# Patient Record
Sex: Male | Born: 1945 | Race: White | Hispanic: No | Marital: Married | Smoking: Never smoker
Health system: Southern US, Community
[De-identification: ages and names within clinical notes are randomized; demographics above are authoritative.]

## PROBLEM LIST (undated history)

## (undated) DIAGNOSIS — I10 Essential (primary) hypertension: Secondary | ICD-10-CM

## (undated) DIAGNOSIS — J40 Bronchitis, not specified as acute or chronic: Secondary | ICD-10-CM

## (undated) DIAGNOSIS — J449 Chronic obstructive pulmonary disease, unspecified: Secondary | ICD-10-CM

## (undated) DIAGNOSIS — J189 Pneumonia, unspecified organism: Secondary | ICD-10-CM

## (undated) DIAGNOSIS — M1990 Unspecified osteoarthritis, unspecified site: Secondary | ICD-10-CM

## (undated) DIAGNOSIS — Z87898 Personal history of other specified conditions: Secondary | ICD-10-CM

## (undated) DIAGNOSIS — R7303 Prediabetes: Secondary | ICD-10-CM

## (undated) DIAGNOSIS — G709 Myoneural disorder, unspecified: Secondary | ICD-10-CM

## (undated) HISTORY — DX: Bronchitis, not specified as acute or chronic: J40

## (undated) HISTORY — PX: EYE SURGERY: SHX253

## (undated) HISTORY — DX: Essential (primary) hypertension: I10

---

## 2011-06-01 HISTORY — PX: BACK SURGERY: SHX140

## 2012-07-01 HISTORY — PX: TOTAL HIP ARTHROPLASTY: SHX124

## 2018-07-23 ENCOUNTER — Ambulatory Visit: Payer: Medicare Other | Admitting: Allergy & Immunology

## 2018-07-23 ENCOUNTER — Encounter: Payer: Self-pay | Admitting: Allergy & Immunology

## 2018-07-23 VITALS — BP 138/82 | HR 56 | Temp 97.7°F | Resp 16 | Ht 71.0 in | Wt 185.0 lb

## 2018-07-23 DIAGNOSIS — L2389 Allergic contact dermatitis due to other agents: Secondary | ICD-10-CM | POA: Diagnosis not present

## 2018-07-23 MED ORDER — CETIRIZINE HCL 10 MG PO TABS: 10.0000 mg | ORAL_TABLET | Freq: Two times a day (BID) | ORAL | 5 refills | Status: DC | PRN

## 2018-07-23 NOTE — Progress Notes (Signed)
NEW PATIENT  Date of Service/Encounter:  07/23/18  Referring provider: Worthy Rancher, MD   Assessment:   Allergic contact dermatitis due to poison sumac  Plan/Recommendations:   1. Allergic contact dermatitis due to poison sumac - Unfortunately, there is no immunotherapy for treating sensitivity to poison sumac.  - Once your immune system Reilly started reacting, it is hard to get it under control.  - In the future, I would recommend using a detergent like Tecnu to remove the oil after exposure (use within four hours of exposure). - It might be useful to keep this with you during hikes and wipe yourself off with it during the hike itself. - Definitely try the preventative ointment that you talked to me about too (email me with the name of it to [email or web address]). - In the meantime, start a long term prednisone taper: 30mg  (three tablets) twice daily for six days, 20mg  (two tablets) twice daily for six days, 10mg  (two tablets) twice daily for six days, 10mg  (one tablet) daily for six days, then STOP. - Restart the Zyrtec (cetirizine) 10mg  but increase to 2-3 times daily for the next 1-2 weeks until the prednisone starts to work, then change to one tablet daily thereafter until you have fully healed.   2. Return if symptoms worsen or fail to improve.  Subjective:   Stephen Reilly is a 72 y.o. male presenting today for evaluation of  Chief Complaint  Patient presents with  . Allergic Reaction    posion sumac, 2 steriod shots and prednisone started in May    Stephen Reilly a history of the following: There are no active problems to display for this patient.   History obtained from: chart review and patient.  [name] was referred by Stephen Rancher, MD.     [name] is a 72 y.o. male presenting for an evaluation of a rash. He got his first case of poison sumac at [facility] and ad an outbreak in May 2019. He got a steroid shot at that time with  triamcinolone. Symptoms improved and resolved completely. He never knew that he was allergic to the poison sumac until this year and prior ot this he did not have any problems with this. He was actually on a trail that was not well kept and overgrown, which is why there was so much poison sumac.   Then he hiked at [facility]. He knew he was going through poison sumac at that time. He Reilly now had symptoms for six weeks. He Reilly been to four different visits and received multiple courses of steroids. He Reilly had problems with sleeping difficulties. He did have cetirizine prescribed at one point (10mg  daily for 30 days). He received this on July 10th.    However, despite this, he Reilly continued to have problems with the rash. It will improve, but then when the five days of prednisone is finished, it will flare back. He now Reilly some preventative lotions to put on before future hikes.   Otherwise, there is no history of other atopic diseases, including asthma, drug allergies, food allergies, environmental allergies, stinging insect allergies, or urticaria. There is no significant infectious history. Vaccinations are up to date.    Past Medical History: There are no active problems to display for this patient.   Medication List:  Allergies as of 07/23/2018      Reactions   Oxycodone Nausea Only, Other (See Comments)   Sweats, nausea and low BP Sweats, nausea and low BP  Medication List        Accurate as of 07/23/18  1:16 PM. Always use your most recent med list.          atorvastatin 10 MG tablet Commonly known as:  LIPITOR   gabapentin 300 MG capsule Commonly known as:  NEURONTIN gabapentin 300 mg capsule  TAKE 1 TO 2 CAPSULES 3 TIMES A DAY AS NEEDED   triamcinolone ointment 0.1 % Commonly known as:  KENALOG Apply to affected areas 1-2x/day prn       Birth History: non-contributory.   Developmental History: non-contributory.   Past Surgical History: Past Surgical  History:  Procedure Laterality Date  . BACK SURGERY  06/2011  . 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 Asthma Neg Hx   . Allergic rhinitis Neg Hx   . Immunodeficiency Neg Hx   . Angioedema Neg Hx   . Eczema Neg Hx   . Urticaria Neg Hx      Social History: Stephen HiddenGary lives at home with his wife. They live in a 72 year old home.  There is carpeting throughout the home.  They have gas and electric heating with central cooling.  There is 1 dog and one bird in the home.  There are no dust mite covers on the bedding.  There is no tobacco exposure.  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  [place].  He Reilly been an avid hiker for the past 6 years since retiring.    Review of Systems: a 14-point review of systems is pertinent for what is mentioned in HPI.  Otherwise, all other systems were negative. Constitutional: negative other than that listed in the HPI Eyes: negative other than that listed in the HPI Ears, nose, mouth, throat, and face: negative other than that listed in the HPI Respiratory: negative other than that listed in the HPI Cardiovascular: negative other than that listed in the HPI Gastrointestinal: negative other than that listed in the HPI Genitourinary: negative other than that listed in the HPI Integument: negative other than that listed in the HPI Hematologic: negative other than that listed in the HPI Musculoskeletal: negative other than that listed in the HPI Neurological: negative other than that listed in the HPI Allergy/Immunologic: negative other than that listed in the HPI    Objective:   Blood pressure 138/82, pulse (!) 56, temperature 97.7 F (36.5 C), temperature source Oral, resp. rate 16, height 5\' 11"  (1.803 m), weight 185 lb (83.9 kg), SpO2 98 %. Body mass index is 25.8 kg/m.   Physical Exam:  General:  Alert, interactive, in no acute distress. Pleasant male. Talkative.  Eyes: No conjunctival injection bilaterally, no discharge on the right, no discharge on the left and no Horner-Trantas dots present. PERRL bilaterally. EOMI without pain. No photophobia.  Ears: Right TM pearly gray with normal light reflex, Left TM pearly gray with normal light reflex, Right TM intact without perforation and Left TM intact without perforation.  Nose/Throat: External nose within normal limits and septum midline. Turbinates edematous and pale with clear discharge. Posterior oropharynx erythematous with cobblestoning in the posterior oropharynx. Tonsils 2+ without exudates.  Tongue without thrush and Geographic tongue present. Neck: Supple without thyromegaly. Trachea midline. Adenopathy: no enlarged lymph nodes appreciated in the anterior cervical, occipital, axillary, epitrochlear, inguinal, or popliteal regions. Lungs: Clear to auscultation without wheezing,  rhonchi or rales. No increased work of breathing. CV: Normal S1/S2. No murmurs. Capillary refill <2 seconds.  Abdomen: Nondistended, nontender. No guarding or rebound tenderness. Bowel sounds present in all fields and hypoactive  Skin: Dry, erythematous, excoriated patches on the bilateral arms, legs, and neck. There is no active drainage and no honey crusting. Extremities:  No clubbing, cyanosis or edema. Neuro:   Grossly intact. No focal deficits appreciated. Responsive to questions.  Diagnostic studies: none      Malachi Bonds, MD [facility]

## 2018-07-23 NOTE — Patient Instructions (Addendum)
1. Allergic contact dermatitis due to poison sumac - Unfortunately, there is no immunotherapy for treating sensitivity to poison sumac.  - Once your immune system has started reacting, it is hard to get it under control.  - In the future, I would recommend using a detergent like Tecnu to remove the oil after exposure (use within four hours of exposure). - It might be useful to keep this with you during hikes and wipe yourself off with it during the hike itself. - Definitely try the preventative ointment that you talked to me about too (email me with the name of it to [name]). - In the meantime, start a long term prednisone taper: 30mg  (three tablets) twice daily for six days, 20mg  (two tablets) twice daily for six days, 10mg  (two tablets) twice daily for six days, 10mg  (one tablet) daily for six days, then STOP. - Restart the Zyrtec (cetirizine) 10mg  but increase to 2-3 times daily for the next 1-2 weeks until the prednisone starts to work, then change to one tablet daily thereafter until you have fully healed.   2. Return if symptoms worsen or fail to improve.   Please inform us of any Emergency Department visits, hospitalizations, or changes in symptoms. Call us before going to the ED for breathing or allergy symptoms since we might be able to fit you in for a sick visit. Feel free to contact us anytime with any questions, problems, or concerns.  It was a pleasure to meet you today!  Websites that have reliable patient information: 1. American Academy of Asthma, Allergy, and Immunology: www.aaaai.org 2. Food Allergy Research and Education (FARE): foodallergy.org 3. Mothers of Asthmatics: http://www.asthmacommunitynetwork.org 4. American College of Allergy, Asthma, and Immunology: MissingWeapons.cawww.acaai.org   Make sure you are registered to vote! If you have moved or changed any of your contact information, you will need to get this updated before voting!

## 2018-07-23 NOTE — Addendum Note (Signed)
Addended by: Maryjean MornFREEMAN, LOGAN D on: 07/23/2018 01:48 PM   Modules accepted: Orders

## 2018-08-11 ENCOUNTER — Telehealth: Payer: Self-pay | Admitting: Allergy

## 2018-08-11 NOTE — Telephone Encounter (Signed)
Patient called and said he was down to one prednisone tablet a day. Said the poison sumac was coming back worse.Said at first the prednisone helped but as he tapped down it came back. Patient is really frustrated he says he has constant itching Wanted to know what to do? Phone number [phone]. Thanks

## 2018-08-12 NOTE — Telephone Encounter (Signed)
Let's have him come in for a DepoMedrol 80mg  injection and we can do another two week prednisone taper. Has he had new exposures at all?   After he gets his injection, please send him home with the following taper:  20mg  BID for 5 days, 10mg  BID for five days, 10mg  QD for 5 days, then STOP (35 tablets total).   I will talk to my colleagues about other options.   Malachi BondsJoel Desha Bitner, MD [facility]

## 2018-08-13 NOTE — Telephone Encounter (Signed)
Called home number and received no answer.

## 2018-08-20 NOTE — Telephone Encounter (Signed)
Called patient this morning and did not received answer.

## 2018-09-02 NOTE — Telephone Encounter (Signed)
I have attempted to contact this patient multiple times. It has now been 2 weeks. I will complete this message and see if patient calls back later.

## 2018-09-14 ENCOUNTER — Ambulatory Visit: Payer: Medicare Other | Admitting: Pediatrics

## 2018-09-14 ENCOUNTER — Encounter: Payer: Self-pay | Admitting: Pediatrics

## 2018-09-14 VITALS — BP 140/78 | HR 62 | Temp 97.8°F | Resp 16

## 2018-09-14 DIAGNOSIS — L255 Unspecified contact dermatitis due to plants, except food: Secondary | ICD-10-CM | POA: Insufficient documentation

## 2018-09-14 MED ORDER — TRIAMCINOLONE ACETONIDE 0.1 % EX OINT: TOPICAL_OINTMENT | CUTANEOUS | 2 refills | Status: DC

## 2018-09-14 MED ORDER — METHYLPREDNISOLONE ACETATE 80 MG/ML IJ SUSP
80.0000 mg | Freq: Once | INTRAMUSCULAR | Status: AC
  Administered 2018-09-14: 80 mg via INTRAMUSCULAR

## 2018-09-14 NOTE — Patient Instructions (Addendum)
Depo-Medrol 80 mg- 1 injection today Triamcinolone 0.1% ointment twice a day if needed to red itchy areas below the face Tomorrow start prednisone 10 mg twice a day for 4 days, 10 mg on the fifth day Call us if you are not doing well on this treatment plan

## 2018-09-14 NOTE — Progress Notes (Signed)
  [address] [place] [address] Dept: [phone]  FOLLOW UP NOTE  Patient ID: Stephen Reilly, male    DOB: 1946-10-05  Age: 72 y.o. MRN: [id number] Date of Office Visit: 09/14/2018  Assessment  Chief Complaint: Pruritis  HPI Stephen Reilly presents for follow-up of a contact dermatitis to poison ivy and  poison sumac. In May of this year he had a poison ivy reaction which required the use of steroids and prednisone for several weeks.  On July 1 of this summer he was exposed to poison sumac and again had a reaction.  He was given prednisone for about 2 weeks.  Since then he has had several areas of red itchy rash.  His last dose of prednisone was in mid August   Drug Allergies:  Allergies  Allergen Reactions  . Oxycodone Nausea Only and Other (See Comments)    Sweats, nausea and low BP Sweats, nausea and low BP     Physical Exam: BP 140/78   Pulse 62   Temp 97.8 F (36.6 C) (Oral)   Resp 16   SpO2 94%    Physical Exam  Constitutional: He is oriented to person, place, and time. He appears well-developed and well-nourished.  HENT:  Eyes normal.  Ears normal.  Nose normal.  Pharynx normal.  Neck: Neck supple.  Cardiovascular:  S1-S2 normal no murmurs  Pulmonary/Chest:  Clear to percussion and auscultation  Lymphadenopathy:    He has no cervical adenopathy.  Neurological: He is alert and oriented to person, place, and time.  Skin:  Several poison  sumac like areas on his arms legs and neck  Psychiatric: He has a normal mood and affect. His behavior is normal. Judgment and thought content normal.  Vitals reviewed.   Diagnostics:    Assessment and Plan: 1. Dermatitis due to plants, including poison ivy, sumac, and oak     Meds ordered this encounter  Medications  . methylPREDNISolone acetate (DEPO-MEDROL) injection 80 mg  . triamcinolone ointment (KENALOG) 0.1 %    Sig: Apply twice daily as needed to red itchy areas below the face    Dispense:  60 g   Refill:  2    Patient Instructions  Depo-Medrol 80 mg- 1 injection today Triamcinolone 0.1% ointment twice a day if needed to red itchy areas below the face Tomorrow start prednisone 10 mg twice a day for 4 days, 10 mg on the fifth day Call us if you are not doing well on this treatment plan   Return if symptoms worsen or fail to improve.    Thank you for the opportunity to care for this patient.  Please do not hesitate to contact me with questions.  Tonette Bihari, M.D.  [facility] [address] [place], [place] [address] [phone]

## 2018-09-22 ENCOUNTER — Telehealth: Payer: Self-pay | Admitting: Allergy

## 2018-09-22 ENCOUNTER — Other Ambulatory Visit: Payer: Self-pay | Admitting: Allergy

## 2018-09-22 MED ORDER — PREDNISONE 10 MG PO TABS: ORAL_TABLET | ORAL | 0 refills | Status: DC

## 2018-09-22 NOTE — Telephone Encounter (Signed)
Faxed in prednisone and informed patient. Informed Patient of Dr. Nunzio Cobbs note.

## 2018-09-22 NOTE — Telephone Encounter (Signed)
Please provide prednisone 10 mg daily for the next 5 days.  If he is still having a problem he should come in and see Dr. Beaulah Dinning or Thurston Hole on Monday.

## 2018-09-22 NOTE — Telephone Encounter (Signed)
Patient seen Dr. Beaulah Dinning on 09-14-2018 .Got a depro-medrol injection and started prednisone the next day. Finished it on Sunday. Started itching really bad last night. Wanted to know if we could call out some  more prednisone? Dermatitis due to plants Please advise. Thanks

## 2018-10-05 ENCOUNTER — Telehealth: Payer: Self-pay

## 2018-10-05 NOTE — Telephone Encounter (Signed)
Pt was given prednisone by Dr. Nunzio Cobbs on 09/22/18 but is still breaking out in a rash from poison sumac. Was wondering if he needed more prednisone or if there was anything else he could do. Please advise

## 2018-10-06 NOTE — Telephone Encounter (Signed)
Call patient.  If he is still breaking out and  the creams are not taking care of it , please call in prednisone 10 mg twice a day for 4 days, 10 mg on the fifth day

## 2018-10-06 NOTE — Telephone Encounter (Signed)
Called pt, no answer and couldn't leave message

## 2018-10-18 NOTE — Telephone Encounter (Signed)
Tried calling pt no answer

## 2018-11-05 ENCOUNTER — Ambulatory Visit: Payer: Medicare Other | Admitting: Allergy

## 2018-11-05 ENCOUNTER — Encounter: Payer: Self-pay | Admitting: Allergy

## 2018-11-05 VITALS — BP 134/60 | HR 100 | Temp 97.9°F | Resp 16

## 2018-11-05 DIAGNOSIS — R21 Rash and other nonspecific skin eruption: Secondary | ICD-10-CM | POA: Diagnosis not present

## 2018-11-05 DIAGNOSIS — L299 Pruritus, unspecified: Secondary | ICD-10-CM

## 2018-11-05 MED ORDER — HYDROXYZINE HCL 10 MG PO TABS: 10.0000 mg | ORAL_TABLET | Freq: Every day | ORAL | 5 refills | Status: DC | PRN

## 2018-11-05 NOTE — Patient Instructions (Addendum)
Get bloodwork  Start zyrtec 10mg  in the morning Start hydroxyzine 10-20mg  1 hour before bedtime as needed for itching.  Stop using the lavendar oils and creams.  Wash shoes or get rid off the shoes that you went through the poison sumac field  Follow up in 4 weeks and consider patch testing in the future.      Skin care recommendations  Bath time: . Always use lukewarm water. AVOID very hot or cold water. Marland Kitchen. Keep bathing time to 5-10 minutes. . Do NOT use bubble bath. . Use a mild soap and use just enough to wash the dirty areas. . Do NOT scrub skin vigorously.  . After bathing, pat dry your skin with a towel. Do NOT rub or scrub the skin.  Moisturizers and prescriptions:  . ALWAYS apply moisturizers immediately after bathing (within 3 minutes). This helps to lock-in moisture. . Use the moisturizer several times a day over the whole body. Peri Jefferson. Good summer moisturizers include: Aveeno, CeraVe, Cetaphil. Peri Jefferson. Good winter moisturizers include: Aquaphor, Vaseline, Cerave, Cetaphil, Eucerin, Vanicream. . When using moisturizers along with medications, the moisturizer should be applied about one hour after applying the medication to prevent diluting effect of the medication or moisturize around where you applied the medications. When not using medications, the moisturizer can be continued twice daily as maintenance.  Laundry and clothing: . Avoid laundry products with added color or perfumes. . Use unscented hypo-allergenic laundry products such as Tide free, Cheer free & gentle, and All free and clear.  . If the skin still seems dry or sensitive, you can try double-rinsing the clothes. . Avoid tight or scratchy clothing such as wool. . Do not use fabric softeners or dyer sheets.

## 2018-11-05 NOTE — Progress Notes (Signed)
Follow Up Note  RE: Stephen Reilly MRN: [id number] DOB: [birth year] Date of Office Visit: 11/05/2018  Referring provider: Worthy Rancher, MD Primary care provider: Worthy Rancher, MD  Chief Complaint: Poison Sumac (head to toe)  History of Present Illness: I had the pleasure of seeing Stephen Reilly for a follow up visit at the [facility] on 11/06/2018. He is a 72 y.o. male, who is being followed for rash. Today he is here for new complaint of persistent rash. His previous allergy office visit was on 09/14/2018 with Dr. Beaulah Dinning.   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 Treated with oral prednisone and in 2 weeks the rash resolved.   4 weeks afterwards he went to a hike and went through poison sumac field thinking he would just get another course of prednisone and it will resolve. However, the rash did not go away with prednisone and the depo injection. It does seem to travel around his body and now has a flare in the medial inner thigh b/l and on his feet.   Describes it as pruritic and currently using triamcinolone ointment which helps the itching only for 2-3 hours at a time. Patient has washed all the clothing he was wearing when going through the poison sumac field however he has not washed the shoes. He does wear those hiking shoes sometimes.   Patient is questioning whether the second course of prednisone did not work as well because he was taking some type of immune boosting pills and elderberry.   Denies any changes in diet, medications, personal care products or recent infections. He is using some type of Lavender cream/oil on the body.   No recent bloodwork. Not up date with colonoscopy.  Assessment and Plan: Stephen Reilly is a 72 y.o. male with: Pruritus Persistent pruritus and traveling rash since June after exposure to poison sumac. Completed prednisone and IM depo injection with no complete resolution of symptoms.  Discussed with  patient that this rash is not likely to be due to poison sumac since he has not had any repeated exposures to it since June. I did recommend that he washes his hiking boots or not use it as it still may contain some of the resin.   Get bloodwork as below to rule out any other etiologies.  Discussed proper skin care including not using any essential oils on the body.  Start zyrtec 10mg  in the morning.  Start hydroxyzine 10-20mg  1 hour before bedtime as needed for itching.  If above regimen does not control symptoms and bloodwork is unremarkable then consider patch testing/skin biopsy in future.    Return in about 4 weeks (around 12/03/2018).  Meds ordered this encounter  Medications  . hydrOXYzine (ATARAX/VISTARIL) 10 MG tablet    Sig: Take 1 tablet (10 mg total) by mouth daily as needed for itching.    Dispense:  30 tablet    Refill:  5    Lab Orders     Alpha-Gal Panel     ANA w/Reflex if Positive     CBC with Differential/Platelet     Comprehensive metabolic panel     Thyroid Cascade Profile     Allergens, Zone 2  Diagnostics: None.  Medication List:  Current Outpatient Medications  Medication Sig Dispense Refill  . atorvastatin (LIPITOR) 10 MG tablet     . cetirizine (ZYRTEC) 10 MG tablet Take 1 tablet (10 mg total) by mouth 2 (two) times  daily as needed for allergies. 60 tablet 5  . hydrOXYzine (ATARAX/VISTARIL) 10 MG tablet Take 1 tablet (10 mg total) by mouth daily as needed for itching. 30 tablet 5  . triamcinolone ointment (KENALOG) 0.1 % Apply twice daily as needed to red itchy areas below the face (Patient not taking: Reported on 11/05/2018) 60 g 2   No current facility-administered medications for this visit.    Allergies: Allergies  Allergen Reactions  . Oxycodone Nausea Only and Other (See Comments)    Sweats, nausea and low BP Sweats, nausea and low BP    I reviewed his past medical history, social history, family history, and environmental history and no  significant changes have been reported from previous visit on 09/14/2018.  Review of Systems  Constitutional: Negative for appetite change, chills, fever and unexpected weight change.  HENT: Negative for congestion and rhinorrhea.   Eyes: Negative for itching.  Respiratory: Negative for cough, chest tightness, shortness of breath and wheezing.   Gastrointestinal: Negative for abdominal pain.  Skin: Positive for rash.  Neurological: Negative for headaches.   Objective: BP 134/60   Pulse 100   Temp 97.9 F (36.6 C) (Oral)   Resp 16   SpO2 98%  There is no height or weight on file to calculate BMI. Physical Exam  Constitutional: He is oriented to person, place, and time. He appears well-developed and well-nourished.  HENT:  Head: Normocephalic and atraumatic.  Right Ear: External ear normal.  Left Ear: External ear normal.  Nose: Nose normal.  Mouth/Throat: Oropharynx is clear and Reilly.  Eyes: Conjunctivae and EOM are normal.  Neck: Neck supple.  Cardiovascular: Normal rate, regular rhythm and normal heart sounds. Exam reveals no gallop and no friction rub.  No murmur heard. Pulmonary/Chest: Effort normal and breath sounds normal. He has no wheezes. He has no rales.  Neurological: He is alert and oriented to person, place, and time.  Skin: Skin is warm. Rash noted.  Erythematous papular rash on inner thigh b/l. Excoriation marks with scabbing on the feet b/l.  Psychiatric: He has a normal mood and affect. His behavior is normal.  Nursing note and vitals reviewed.  Previous notes and tests were reviewed. The plan was reviewed with the patient/family, and all questions/concerned were addressed.  It was my pleasure to see Stephen Reilly today and participate in his care. Please feel free to contact me with any questions or concerns.  Sincerely,  Wyline MoodYoon Kim, DO Allergy & Immunology  [facility] [place] office: [phone] [facility] [place]  [phone]

## 2018-11-06 DIAGNOSIS — L299 Pruritus, unspecified: Secondary | ICD-10-CM | POA: Insufficient documentation

## 2018-11-06 DIAGNOSIS — R21 Rash and other nonspecific skin eruption: Secondary | ICD-10-CM | POA: Insufficient documentation

## 2018-11-06 NOTE — Assessment & Plan Note (Signed)
Persistent pruritus and traveling rash since June after exposure to poison sumac. Completed prednisone and IM depo injection with no complete resolution of symptoms.  Discussed with patient that this rash is not likely to be due to poison sumac since he has not had any repeated exposures to it since June. I did recommend that he washes his hiking boots or not use it as it still may contain some of the resin.   Get bloodwork as below to rule out any other etiologies.  Discussed proper skin care including not using any essential oils on the body.  Start zyrtec 10mg  in the morning.  Start hydroxyzine 10-20mg  1 hour before bedtime as needed for itching.  If above regimen does not control symptoms and bloodwork is unremarkable then consider patch testing/skin biopsy in future.

## 2018-11-09 ENCOUNTER — Telehealth: Payer: Self-pay | Admitting: Allergy

## 2018-11-09 ENCOUNTER — Other Ambulatory Visit: Payer: Self-pay

## 2018-11-09 NOTE — Telephone Encounter (Signed)
Is it ok to change?  

## 2018-11-09 NOTE — Telephone Encounter (Signed)
Patient is wanting the triamcinolone ointment In a cream instead of the gel  Please call in script to [facility] in [place]

## 2018-11-10 MED ORDER — TRIAMCINOLONE ACETONIDE 0.1 % EX CREA: 1.0000 "application " | TOPICAL_CREAM | Freq: Two times a day (BID) | CUTANEOUS | 1 refills | Status: DC | PRN

## 2018-11-10 NOTE — Telephone Encounter (Signed)
Tried call pt no answer no voicemail available

## 2018-11-10 NOTE — Telephone Encounter (Signed)
Yes okay to change. I sent in new script.

## 2018-11-12 ENCOUNTER — Encounter: Payer: Self-pay | Admitting: *Deleted

## 2018-11-12 LAB — ALLERGENS, ZONE 2
Alternaria Alternata IgE: <0.1 kU/L
Amer Sycamore IgE Qn: 0.15 kU/L — AB
Aspergillus Fumigatus IgE: <0.1 kU/L
Bahia Grass IgE: <0.1 kU/L
Cat Dander IgE: <0.1 kU/L
Cedar, Mountain IgE: 0.22 kU/L — AB
Cladosporium Herbarum IgE: <0.1 kU/L
Cockroach, American IgE: 0.52 kU/L — AB
Common Silver Birch IgE: <0.1 kU/L
D Farinae IgE: 11.7 kU/L — AB
D Pteronyssinus IgE: 8.84 kU/L — AB
Dog Dander IgE: <0.1 kU/L
Elm, American IgE: <0.1 kU/L
Hickory, White IgE: <0.1 kU/L
Johnson Grass IgE: 0.15 kU/L — AB
Maple/Box Elder IgE: <0.1 kU/L
Mucor Racemosus IgE: <0.1 kU/L
Mugwort IgE Qn: 0.16 kU/L — AB
Nettle IgE: 0.34 kU/L — AB
Penicillium Chrysogen IgE: <0.1 kU/L
Pigweed, Rough IgE: <0.1 kU/L
Plantain, English IgE: 0.14 kU/L — AB
Ragweed, Short IgE: 0.13 kU/L — AB
Stemphylium Herbarum IgE: <0.1 kU/L
Sweet gum IgE RAST Ql: <0.1 kU/L
T007-IGE OAK, WHITE: 0.11 kU/L — AB
White Mulberry IgE: <0.1 kU/L

## 2018-11-12 LAB — COMPREHENSIVE METABOLIC PANEL
ALK PHOS: 61 IU/L (ref 39–117)
ALT: 35 IU/L (ref 0–44)
AST: 42 IU/L — AB (ref 0–40)
Albumin/Globulin Ratio: 2.1 (ref 1.2–2.2)
Albumin: 4.6 g/dL (ref 3.5–4.8)
BUN/Creatinine Ratio: 12 (ref 10–24)
BUN: 10 mg/dL (ref 8–27)
Bilirubin Total: 0.5 mg/dL (ref 0.0–1.2)
CO2: 25 mmol/L (ref 20–29)
Calcium: 9.7 mg/dL (ref 8.6–10.2)
Chloride: 103 mmol/L (ref 96–106)
Creatinine, Ser: 0.82 mg/dL (ref 0.76–1.27)
GFR calc Af Amer: 102 mL/min/{1.73_m2} (ref >59)
GFR calc non Af Amer: 88 mL/min/{1.73_m2} (ref >59)
GLOBULIN, TOTAL: 2.2 g/dL (ref 1.5–4.5)
Glucose: 89 mg/dL (ref 65–99)
POTASSIUM: 3.9 mmol/L (ref 3.5–5.2)
SODIUM: 142 mmol/L (ref 134–144)
Total Protein: 6.8 g/dL (ref 6.0–8.5)

## 2018-11-12 LAB — CBC WITH DIFFERENTIAL/PLATELET
BASOS ABS: 0.1 10*3/uL (ref 0.0–0.2)
Basos: 1 %
EOS (ABSOLUTE): 0.3 10*3/uL (ref 0.0–0.4)
EOS: 5 %
HEMATOCRIT: 46.6 % (ref 37.5–51.0)
Hemoglobin: 15.8 g/dL (ref 13.0–17.7)
Immature Grans (Abs): 0.1 10*3/uL (ref 0.0–0.1)
Immature Granulocytes: 1 %
LYMPHS ABS: 1.9 10*3/uL (ref 0.7–3.1)
Lymphs: 27 %
MCH: 31.9 pg (ref 26.6–33.0)
MCHC: 33.9 g/dL (ref 31.5–35.7)
MCV: 94 fL (ref 79–97)
MONOS ABS: 0.5 10*3/uL (ref 0.1–0.9)
Monocytes: 7 %
NEUTROS PCT: 59 %
Neutrophils Absolute: 4.4 10*3/uL (ref 1.4–7.0)
PLATELETS: 248 10*3/uL (ref 150–450)
RBC: 4.95 x10E6/uL (ref 4.14–5.80)
RDW: 11.8 % — AB (ref 12.3–15.4)
WBC: 7.3 10*3/uL (ref 3.4–10.8)

## 2018-11-12 LAB — ALPHA-GAL PANEL
Beef (Bos spp) IgE: <0.1 kU/L (ref <0.35)
Class Interpretation: 0
Class Interpretation: 0
Lamb/Mutton (Ovis spp) IgE: <0.1 kU/L (ref <0.35)
PORK CLASS INTERPRETATION: 0

## 2018-11-12 LAB — THYROID CASCADE PROFILE: TSH: 3.13 u[IU]/mL (ref 0.450–4.500)

## 2018-11-12 LAB — ANA W/REFLEX IF POSITIVE: ANA: NEGATIVE

## 2018-11-12 NOTE — Telephone Encounter (Signed)
Tried calling pt again no answer

## 2018-11-16 ENCOUNTER — Telehealth: Payer: Self-pay | Admitting: Allergy

## 2018-11-16 ENCOUNTER — Other Ambulatory Visit: Payer: Self-pay

## 2018-11-16 MED ORDER — TRIAMCINOLONE ACETONIDE 0.1 % EX CREA: 1.0000 "application " | TOPICAL_CREAM | Freq: Two times a day (BID) | CUTANEOUS | 1 refills | Status: DC | PRN

## 2018-12-03 ENCOUNTER — Encounter: Payer: Self-pay | Admitting: Allergy

## 2018-12-03 ENCOUNTER — Ambulatory Visit (INDEPENDENT_AMBULATORY_CARE_PROVIDER_SITE_OTHER): Payer: Medicare Other | Admitting: Allergy

## 2018-12-03 VITALS — BP 130/70 | HR 75 | Temp 97.8°F | Resp 16

## 2018-12-03 DIAGNOSIS — L299 Pruritus, unspecified: Secondary | ICD-10-CM | POA: Diagnosis not present

## 2018-12-03 DIAGNOSIS — R21 Rash and other nonspecific skin eruption: Secondary | ICD-10-CM

## 2018-12-03 MED ORDER — MONTELUKAST SODIUM 10 MG PO TABS: 10.0000 mg | ORAL_TABLET | Freq: Every day | ORAL | 5 refills | Status: DC

## 2018-12-03 MED ORDER — CETIRIZINE HCL 10 MG PO TABS: 20.0000 mg | ORAL_TABLET | Freq: Two times a day (BID) | ORAL | 5 refills | Status: DC

## 2018-12-03 MED ORDER — FAMOTIDINE 20 MG PO TABS: 20.0000 mg | ORAL_TABLET | Freq: Two times a day (BID) | ORAL | 5 refills | Status: DC

## 2018-12-03 NOTE — Progress Notes (Signed)
Follow Up Note  RE: Stephen Reilly MRN: [id number] DOB: 1945-12-19 Date of Office Visit: 12/03/2018  Referring provider: Worthy Rancher, MD Primary care provider: Worthy Rancher, MD  Chief Complaint: No chief complaint on file.  History of Present Illness: I had the pleasure of seeing Stephen Reilly for a follow up visit at the [facility]  on 12/06/2018. He is a 73 y.o. male, who is being followed for pruritus and rash. Today he is here for regular follow up visit. His previous allergy office visit was on 11/05/2018 with Dr. Selena Batten.   Pruritus Currently on zyrtec 10mg  in the AM with no benefit. Still very itchy at night despite taking hydroxyzine 10mg  at night. Most nights he only takes 10mg  and there were a few nights he took 20mg . He uses topical triamcinolone cream which only gives him 1-2 hours of relief at best. He has been self-medicating with alcohol so he can fall asleep. Also noticed worsening symptoms with activity so he has been not as active as before.   Assessment and Plan: Stephen Reilly is a 74 y.o. male with: Rash Past history - Persistent pruritus and traveling rash since June after exposure to poison sumac. Completed prednisone and IM depo injection with no complete resolution of symptoms. Interim history - No improvement with zyrtec 10mg  in AM and hydroxyzine in 10-20mg  in the PM. Lab work was negative to alpha gal, CBC diff, CMP, ANA, TSH. Positive to dust mite allergy.   Discussed with patient again that this rash is not likely to be due to poison sumac since he has not had any repeated exposures to it since June.  Discussed dust mite avoidance measures.   Continue proper skin care including not using any essential oils on the body.  Refer to dermatology for possible skin biopsy.   Start zyrtec 20mg  in the morning and 20mg  in the evening as long as it does not cause too much drowsiness.   Start Pepcid 20mg  twice a day.  Start Singulair 10mg   daily.  Pruritus See assessment and plan as above.   Return in about 4 months (around 04/03/2019).  Meds ordered this encounter  Medications  . cetirizine (ZYRTEC) 10 MG tablet    Sig: Take 2 tablets (20 mg total) by mouth 2 (two) times daily.    Dispense:  120 tablet    Refill:  5  . famotidine (PEPCID) 20 MG tablet    Sig: Take 1 tablet (20 mg total) by mouth 2 (two) times daily.    Dispense:  60 tablet    Refill:  5  . montelukast (SINGULAIR) 10 MG tablet    Sig: Take 1 tablet (10 mg total) by mouth at bedtime.    Dispense:  30 tablet    Refill:  5   Diagnostics: None.  Medication List:  Current Outpatient Medications  Medication Sig Dispense Refill  . atorvastatin (LIPITOR) 10 MG tablet     . cetirizine (ZYRTEC) 10 MG tablet Take 1 tablet (10 mg total) by mouth 2 (two) times daily as needed for allergies. 60 tablet 5  . hydrOXYzine (ATARAX/VISTARIL) 10 MG tablet Take 1 tablet (10 mg total) by mouth daily as needed for itching. 30 tablet 5  . triamcinolone cream (KENALOG) 0.1 % Apply 1 application topically 2 (two) times daily as needed. Below neck and NOT on face. 45 g 1  . cetirizine (ZYRTEC) 10 MG tablet Take 2 tablets (20 mg total) by mouth 2 (two) times daily. 120  tablet 5  . famotidine (PEPCID) 20 MG tablet Take 1 tablet (20 mg total) by mouth 2 (two) times daily. 60 tablet 5  . montelukast (SINGULAIR) 10 MG tablet Take 1 tablet (10 mg total) by mouth at bedtime. 30 tablet 5   No current facility-administered medications for this visit.    Allergies: Allergies  Allergen Reactions  . Oxycodone Nausea Only and Other (See Comments)    Sweats, nausea and low BP Sweats, nausea and low BP    I reviewed his past medical history, social history, family history, and environmental history and no significant changes have been reported from previous visit on 11/05/2018.  Review of Systems  Constitutional: Negative for appetite change, chills, fever and unexpected weight  change.  HENT: Negative for congestion and rhinorrhea.   Eyes: Negative for itching.  Respiratory: Negative for cough, chest tightness, shortness of breath and wheezing.   Gastrointestinal: Negative for abdominal pain.  Skin: Positive for rash.  Neurological: Negative for headaches.   Objective: BP 130/70 (BP Location: Left Arm, Patient Position: Sitting, Cuff Size: Normal)   Pulse 75   Temp 97.8 F (36.6 C) (Oral)   Resp 16   SpO2 98%  There is no height or weight on file to calculate BMI. Physical Exam  Constitutional: He is oriented to person, place, and time. He appears well-developed and well-nourished.  HENT:  Head: Normocephalic and atraumatic.  Right Ear: External ear normal.  Left Ear: External ear normal.  Nose: Nose normal.  Mouth/Throat: Oropharynx is clear and moist.  Eyes: Conjunctivae and EOM are normal.  Neck: Neck supple.  Cardiovascular: Normal rate, regular rhythm and normal heart sounds. Exam reveals no gallop and no friction rub.  No murmur heard. Pulmonary/Chest: Effort normal and breath sounds normal. He has no wheezes. He has no rales.  Neurological: He is alert and oriented to person, place, and time.  Skin: Skin is warm. Rash noted.  Excoriation marks with scabbing on the feet b/l.  Psychiatric: He has a normal mood and affect. His behavior is normal.  Nursing note and vitals reviewed.  Previous notes and tests were reviewed. The plan was reviewed with the patient/family, and all questions/concerned were addressed.  It was my pleasure to see Stephen Reilly today and participate in his care. Please feel free to contact me with any questions or concerns.  Sincerely,  Wyline Mood, DO Allergy & Immunology  [facility] office: [phone] [facility] office: [phone]

## 2018-12-03 NOTE — Patient Instructions (Addendum)
Pruritus/rash:  Refer to dermatology  Start zyrtec 20mg  in the morning and 20mg  in the evening Start Pepcid 20mg  twice a day Start Singulair 10mg  daily  Follow up in 4 weeks  Control of House Dust Mite Allergen . Dust mite allergens are a common trigger of allergy and asthma symptoms. While they can be found throughout the house, these microscopic creatures thrive in warm, humid environments such as bedding, upholstered furniture and carpeting. . Because so much time is spent in the bedroom, it is essential to reduce mite levels there.  . Encase pillows, mattresses, and box springs in special allergen-proof fabric covers or airtight, zippered plastic covers.  . Bedding should be washed weekly in hot water (130 F) and dried in a hot dryer. Allergen-proof covers are available for comforters and pillows that can't be regularly washed.  Reyes Ivan the allergy-proof covers every few months. Minimize clutter in the bedroom. Keep pets out of the bedroom.  Marland Kitchen Keep humidity less than 50% by using a dehumidifier or air conditioning. You can buy a humidity measuring device called a hygrometer to monitor this.  . If possible, replace carpets with hardwood, linoleum, or washable area rugs. If that's not possible, vacuum frequently with a vacuum that has a HEPA filter. . Remove all upholstered furniture and non-washable window drapes from the bedroom. . Remove all non-washable stuffed toys from the bedroom.  Wash stuffed toys weekly.  . Avoid the following potential triggers: alcohol, tight clothing, NSAIDs.   Skin care recommendations  Bath time: . Always use lukewarm water. AVOID very hot or cold water. Marland Kitchen Keep bathing time to 5-10 minutes. . Do NOT use bubble bath. . Use a mild soap and use just enough to wash the dirty areas. . Do NOT scrub skin vigorously.  . After bathing, pat dry your skin with a towel. Do NOT rub or scrub the skin.  Moisturizers and prescriptions:  . ALWAYS apply moisturizers  immediately after bathing (within 3 minutes). This helps to lock-in moisture. . Use the moisturizer several times a day over the whole body. Peri Jefferson summer moisturizers include: Aveeno, CeraVe, Cetaphil. Peri Jefferson winter moisturizers include: Aquaphor, Vaseline, Cerave, Cetaphil, Eucerin, Vanicream. . When using moisturizers along with medications, the moisturizer should be applied about one hour after applying the medication to prevent diluting effect of the medication or moisturize around where you applied the medications. When not using medications, the moisturizer can be continued twice daily as maintenance.  Laundry and clothing: . Avoid laundry products with added color or perfumes. . Use unscented hypo-allergenic laundry products such as Tide free, Cheer free & gentle, and All free and clear.  . If the skin still seems dry or sensitive, you can try double-rinsing the clothes. . Avoid tight or scratchy clothing such as wool. . Do not use fabric softeners or dyer sheets.

## 2018-12-06 ENCOUNTER — Telehealth: Payer: Self-pay

## 2018-12-06 ENCOUNTER — Encounter: Payer: Self-pay | Admitting: Allergy

## 2018-12-06 NOTE — Assessment & Plan Note (Addendum)
Past history - Persistent pruritus and traveling rash since June after exposure to poison sumac. Completed prednisone and IM depo injection with no complete resolution of symptoms. Interim history - No improvement with zyrtec 10mg  in AM and hydroxyzine in 10-20mg  in the PM. Lab work was negative to alpha gal, CBC diff, CMP, ANA, TSH. Positive to dust mite allergy.   Discussed with patient again that this rash is not likely to be due to poison sumac since he has not had any repeated exposures to it since June.  Discussed dust mite avoidance measures.   Continue proper skin care including not using any essential oils on the body.  Refer to dermatology for possible skin biopsy.   Start zyrtec 20mg  in the morning and 20mg  in the evening as long as it does not cause too much drowsiness.   Start Pepcid 20mg  twice a day.  Start Singulair 10mg  daily.

## 2018-12-06 NOTE — Telephone Encounter (Signed)
Can you please place a refferal to dermatology per dr Selena BattenKim thank you!

## 2018-12-06 NOTE — Assessment & Plan Note (Signed)
.   See assessment and plan as above. 

## 2018-12-07 NOTE — Telephone Encounter (Signed)
Patients referral has been faxed to [facility]. [phone].   Their office will give the patient a call.   Thanks

## 2018-12-07 NOTE — Telephone Encounter (Signed)
Thank you :)

## 2018-12-31 ENCOUNTER — Ambulatory Visit: Payer: Medicare Other | Admitting: Allergy

## 2019-01-10 NOTE — Telephone Encounter (Signed)
Patient was seen on 01/04/2019  2/25/20_ Follow up

## 2019-02-02 NOTE — Telephone Encounter (Signed)
Made in error

## 2019-11-17 ENCOUNTER — Other Ambulatory Visit: Payer: Self-pay

## 2019-11-17 MED ORDER — CETIRIZINE HCL 10 MG PO TABS: 20.0000 mg | ORAL_TABLET | Freq: Two times a day (BID) | ORAL | 0 refills | Status: DC

## 2019-12-01 ENCOUNTER — Other Ambulatory Visit: Payer: Self-pay | Admitting: *Deleted

## 2020-07-10 NOTE — Progress Notes (Addendum)
PCP -Mohammed Kindle  PA-C  lov 06-15-20 on chart  Cardiologist -  no  PPM/ICD -  Device Orders -  Rep Notified -   Chest x-ray -  EKG - 06-15-20 on chart  Stress Test - Stress echo 07-13-20 care everywhere ECHO -  Cardiac Cath -   Sleep Study -  CPAP -   Fasting Blood Sugar -  Checks Blood Sugar _____ times a day  Blood Thinner Instructions: Aspirin Instructions:  ERAS Protcol - PRE-SURGERY Ensure   COVID TEST- 8-21  Activity--Can walk flight of stairs without SOB, [place]  Anesthesia review: Pre DM, COPD   Patient denies shortness of breath, fever, cough and chest pain at PAT appointment   All instructions explained to the patient, with a verbal understanding of the material. Patient agrees to go over the instructions while at home for a better understanding. Patient also instructed to self quarantine after being tested for COVID-19. The opportunity to ask questions was provided.

## 2020-07-10 NOTE — Patient Instructions (Addendum)
DUE TO COVID-19 ONLY ONE VISITOR IS ALLOWED TO COME WITH YOU AND STAY IN THE WAITING ROOM ONLY DURING PRE OP AND PROCEDURE DAY OF SURGERY. THE 1 VISITOR  MAY VISIT WITH YOU AFTER SURGERY IN YOUR PRIVATE ROOM DURING VISITING HOURS ONLY!  YOU NEED TO HAVE A COVID 19 TEST ON_8-21-21______ @_______ , THIS TEST MUST BE DONE BEFORE SURGERY,  COVID TESTING SITE [address] [place] , IT IS ON THE RIGHT GOING OUT [address] APPROXIMATELY  2 MINUTES PAST [employer] ON THE RIGHT. ONCE YOUR COVID TEST IS COMPLETED,  PLEASE BEGIN THE QUARANTINE INSTRUCTIONS AS OUTLINED IN YOUR HANDOUT.                Stephen Reilly  07/10/2020   Your procedure is scheduled on: 07-25-20   Report to [facility] Main  Entrance   Report to admitting at       1150  AM     Call this number if you have problems the morning of surgery [phone]    Remember: .NO SOLID FOOD AFTER MIDNIGHT THE NIGHT PRIOR TO SURGERY. NOTHING BY MOUTH EXCEPT CLEAR LIQUIDS UNTIL  1125 am . PLEASE FINISH G2  DRINK PER SURGEON ORDER  WHICH NEEDS TO BE COMPLETED AT       1125 am  then nothing by mouth .    CLEAR LIQUID DIET until 1125 am then nothing by mouth   Foods Allowed                                                                           Foods Excluded   Black Coffee and tea, regular and decaf                             liquids that you cannot  Plain Jell-O any favor except red or purple                                           see through such as: Fruit ices (not with fruit pulp)                                                     milk, soups, orange juice  Iced Popsicles                                                      All solid food Carbonated beverages, regular and diet                                    Cranberry, grape and apple juices Sports drinks like Gatorade Lightly seasoned clear broth or consume(fat free) Sugar,  honey  syrup   _____________________________________________________________________   BRUSH YOUR TEETH MORNING OF SURGERY AND RINSE YOUR MOUTH OUT, NO CHEWING GUM CANDY OR MINTS.     Take these medicines the morning of surgery with A SIP OF WATER: flomax                                 You may not have any metal on your body including hair pins and              piercings  Do not wear jewelry,  lotions, powders or perfumes, deodorant              Men may shave face and neck.   Do not bring valuables to the hospital. [facility] IS NOT             RESPONSIBLE   FOR VALUABLES.  Contacts, dentures or bridgework may not be worn into surgery.      Patients discharged the day of surgery will not be allowed to drive home. IF YOU ARE HAVING SURGERY AND GOING HOME THE SAME DAY, YOU MUST HAVE AN ADULT TO DRIVE YOU HOME AND BE WITH YOU FOR 24 HOURS. YOU MAY GO HOME BY TAXI OR UBER OR ORTHERWISE, BUT AN ADULT MUST ACCOMPANY YOU HOME AND STAY WITH YOU FOR 24 HOURS.  Name and phone number of your driver:  Special Instructions: N/A              Please read over the following fact sheets you were given: _____________________________________________________________________             [facility] - Preparing for Surgery Before surgery, you can play an important role.  Because skin is not sterile, your skin needs to be as free of germs as possible.  You can reduce the number of germs on your skin by washing with CHG (chlorahexidine gluconate) soap before surgery.  CHG is an antiseptic cleaner which kills germs and bonds with the skin to continue killing germs even after washing. Please DO NOT use if you have an allergy to CHG or antibacterial soaps.  If your skin becomes reddened/irritated stop using the CHG and inform your nurse when you arrive at Short Stay. Do not shave (including legs and underarms) for at least 48 hours prior to the first CHG shower.  You may shave your face/neck. Please follow  these instructions carefully:  1.  Shower with CHG Soap the night before surgery and the  morning of Surgery.  2.  If you choose to wash your hair, wash your hair first as usual with your  normal  shampoo.  3.  After you shampoo, rinse your hair and body thoroughly to remove the  shampoo.                           4.  Use CHG as you would any other liquid soap.  You can apply chg directly  to the skin and wash                       Gently with a scrungie or clean washcloth.  5.  Apply the CHG Soap to your body ONLY FROM THE NECK DOWN.   Do not use on face/ open  Wound or open sores. Avoid contact with eyes, ears mouth and genitals (private parts).                       Wash face,  Genitals (private parts) with your normal soap.             6.  Wash thoroughly, paying special attention to the area where your surgery  will be performed.  7.  Thoroughly rinse your body with warm water from the neck down.  8.  DO NOT shower/wash with your normal soap after using and rinsing off  the CHG Soap.                9.  Pat yourself dry with a clean towel.            10.  Wear clean pajamas.            11.  Place clean sheets on your bed the night of your first shower and do not  sleep with pets. Day of Surgery : Do not apply any lotions/deodorants the morning of surgery.  Please wear clean clothes to the hospital/surgery center.  FAILURE TO FOLLOW THESE INSTRUCTIONS MAY RESULT IN THE CANCELLATION OF YOUR SURGERY PATIENT SIGNATURE_________________________________  NURSE SIGNATURE__________________________________  ________________________________________________________________________   Stephen Reilly  An incentive spirometer is a tool that can help keep your lungs clear and active. This tool measures how well you are filling your lungs with each breath. Taking long deep breaths may help reverse or decrease the chance of developing breathing (pulmonary) problems  (especially infection) following:  A long period of time when you are unable to move or be active. BEFORE THE PROCEDURE   If the spirometer includes an indicator to show your best effort, your nurse or respiratory therapist will set it to a desired goal.  If possible, sit up straight or lean slightly forward. Try not to slouch.  Hold the incentive spirometer in an upright position. INSTRUCTIONS FOR USE  1. Sit on the edge of your bed if possible, or sit up as far as you can in bed or on a chair. 2. Hold the incentive spirometer in an upright position. 3. Breathe out normally. 4. Place the mouthpiece in your mouth and seal your lips tightly around it. 5. Breathe in slowly and as deeply as possible, raising the piston or the ball toward the top of the column. 6. Hold your breath for 3-5 seconds or for as long as possible. Allow the piston or ball to fall to the bottom of the column. 7. Remove the mouthpiece from your mouth and breathe out normally. 8. Rest for a few seconds and repeat Steps 1 through 7 at least 10 times every 1-2 hours when you are awake. Take your time and take a few normal breaths between deep breaths. 9. The spirometer may include an indicator to show your best effort. Use the indicator as a goal to work toward during each repetition. 10. After each set of 10 deep breaths, practice coughing to be sure your lungs are clear. If you have an incision (the cut made at the time of surgery), support your incision when coughing by placing a pillow or rolled up towels firmly against it. Once you are able to get out of bed, walk around indoors and cough well. You may stop using the incentive spirometer when instructed by your caregiver.  RISKS AND COMPLICATIONS  Take your time so you do not get  dizzy or light-headed.  If you are in pain, you may need to take or ask for pain medication before doing incentive spirometry. It is harder to take a deep breath if you are having  pain. AFTER USE  Rest and breathe slowly and easily.  It can be helpful to keep track of a log of your progress. Your caregiver can provide you with a simple table to help with this. If you are using the spirometer at home, follow these instructions: [place] IF:   You are having difficultly using the spirometer.  You have trouble using the spirometer as often as instructed.  Your pain medication is not giving enough relief while using the spirometer.  You develop fever of 100.5 F (38.1 C) or higher. SEEK IMMEDIATE MEDICAL CARE IF:   You cough up bloody sputum that had not been present before.  You develop fever of 102 F (38.9 C) or greater.  You develop worsening pain at or near the incision site. MAKE SURE YOU:   Understand these instructions.  Will watch your condition.  Will get help right away if you are not doing well or get worse. Document Released: 03/30/2007 Document Revised: 02/09/2012 Document Reviewed: 05/31/2007 [facility] Patient Information 2014 [place], [place].   ________________________________________________________________________

## 2020-07-17 ENCOUNTER — Encounter (HOSPITAL_COMMUNITY)
Admission: RE | Admit: 2020-07-17 | Discharge: 2020-07-17 | Disposition: A | Discharge status: Home / Self Care | Payer: Medicare PPO | Source: Ambulatory Visit | Attending: Orthopedic Surgery | Admitting: Orthopedic Surgery

## 2020-07-17 ENCOUNTER — Other Ambulatory Visit: Payer: Self-pay

## 2020-07-17 ENCOUNTER — Encounter (HOSPITAL_COMMUNITY): Payer: Self-pay

## 2020-07-17 DIAGNOSIS — Z01818 Encounter for other preprocedural examination: Secondary | ICD-10-CM | POA: Insufficient documentation

## 2020-07-17 HISTORY — DX: Unspecified osteoarthritis, unspecified site: M19.90

## 2020-07-17 HISTORY — DX: Personal history of other specified conditions: Z87.898

## 2020-07-17 HISTORY — DX: Myoneural disorder, unspecified: G70.9

## 2020-07-17 HISTORY — DX: Pneumonia, unspecified organism: J18.9

## 2020-07-17 HISTORY — DX: Chronic obstructive pulmonary disease, unspecified: J44.9

## 2020-07-17 HISTORY — DX: Prediabetes: R73.03

## 2020-07-17 LAB — COMPREHENSIVE METABOLIC PANEL
ALT: 27 U/L (ref 0–44)
AST: 42 U/L — ABNORMAL HIGH (ref 15–41)
Albumin: 4.3 g/dL (ref 3.5–5.0)
Alkaline Phosphatase: 46 U/L (ref 38–126)
Anion gap: 8 (ref 5–15)
BUN: 16 mg/dL (ref 8–23)
CO2: 27 mmol/L (ref 22–32)
Calcium: 9.3 mg/dL (ref 8.9–10.3)
Chloride: 105 mmol/L (ref 98–111)
Creatinine, Ser: 0.8 mg/dL (ref 0.61–1.24)
GFR calc Af Amer: >60 mL/min (ref >60)
GFR calc non Af Amer: >60 mL/min (ref >60)
Glucose, Bld: 89 mg/dL (ref 70–99)
Potassium: 4 mmol/L (ref 3.5–5.1)
Sodium: 140 mmol/L (ref 135–145)
Total Bilirubin: 0.4 mg/dL (ref 0.3–1.2)
Total Protein: 7.1 g/dL (ref 6.5–8.1)

## 2020-07-17 LAB — CBC
HCT: 44.8 % (ref 39.0–52.0)
Hemoglobin: 15.5 g/dL (ref 13.0–17.0)
MCH: 31.4 pg (ref 26.0–34.0)
MCHC: 34.6 g/dL (ref 30.0–36.0)
MCV: 90.7 fL (ref 80.0–100.0)
Platelets: 244 10*3/uL (ref 150–400)
RBC: 4.94 MIL/uL (ref 4.22–5.81)
RDW: 11.7 % (ref 11.5–15.5)
WBC: 5.6 10*3/uL (ref 4.0–10.5)
nRBC: 0 % (ref 0.0–0.2)

## 2020-07-17 LAB — SURGICAL PCR SCREEN
MRSA, PCR: NEGATIVE
Staphylococcus aureus: NEGATIVE

## 2020-07-17 LAB — HEMOGLOBIN A1C
Hgb A1c MFr Bld: 6.1 % — ABNORMAL HIGH (ref 4.8–5.6)
Mean Plasma Glucose: 128.37 mg/dL

## 2020-07-17 LAB — APTT: aPTT: 26 seconds (ref 24–36)

## 2020-07-17 LAB — PROTIME-INR
INR: 0.9 (ref 0.8–1.2)
Prothrombin Time: 12 seconds (ref 11.4–15.2)

## 2020-07-21 ENCOUNTER — Other Ambulatory Visit (HOSPITAL_COMMUNITY)
Admission: RE | Admit: 2020-07-21 | Discharge: 2020-07-21 | Disposition: A | Discharge status: Home / Self Care | Payer: Medicare PPO | Source: Ambulatory Visit | Attending: Orthopedic Surgery | Admitting: Orthopedic Surgery

## 2020-07-21 DIAGNOSIS — Z20822 Contact with and (suspected) exposure to covid-19: Secondary | ICD-10-CM | POA: Insufficient documentation

## 2020-07-21 DIAGNOSIS — Z01812 Encounter for preprocedural laboratory examination: Secondary | ICD-10-CM | POA: Diagnosis present

## 2020-07-21 LAB — SARS CORONAVIRUS 2 (TAT 6-24 HRS): SARS Coronavirus 2: NEGATIVE

## 2020-07-24 NOTE — H&P (Signed)
TOTAL HIP ADMISSION H&P  Patient is admitted for left total hip arthroplasty.  Subjective:  Chief Complaint: left hip pain  HPI: Stephen Reilly, 74 y.o. male, has a history of pain and functional disability in the left hip(s) due to arthritis and patient has failed non-surgical conservative treatments for greater than 12 weeks to include corticosteriod injections and activity modification.  Onset of symptoms was gradual starting 2 years ago with gradually worsening course since that time.The patient noted no past surgery on the left hip(s).  Patient currently rates pain in the left hip at 8 out of 10 with activity. Patient has worsening of pain with activity and weight bearing and pain that interfers with activities of daily living. Patient has evidence of joint space narrowing by imaging studies. This condition presents safety issues increasing the risk of falls.  There is no current active infection.  Patient Active Problem List   Diagnosis Date Noted  . Pruritus 11/06/2018  . Rash 11/06/2018  . Dermatitis due to plants, including poison ivy, sumac, and oak 09/14/2018   Past Medical History:  Diagnosis Date  . Arthritis   . Bronchitis   . COPD (chronic obstructive pulmonary disease) (HCC)    chronic bronchitis  . History of palpitations    As a teenager  . Neuromuscular disorder (HCC)   . Pneumonia   . Pre-diabetes     Past Surgical History:  Procedure Laterality Date  . BACK SURGERY  06/2011   and 2018  . EYE SURGERY     eye lid surgery  . TOTAL HIP ARTHROPLASTY Right 07/2012    No current facility-administered medications for this encounter.   Current Outpatient Medications  Medication Sig Dispense Refill Last Dose  . folic acid (FOLVITE) 1 MG tablet Take 1 mg by mouth daily as needed (when taking large doses of ibu over a 24 hour period).     Marland Kitchen ibuprofen (ADVIL) 200 MG tablet Take 400-600 mg by mouth 3 (three) times daily as needed for moderate pain.     . tamsulosin  (FLOMAX) 0.4 MG CAPS capsule Take 0.4 mg by mouth daily.     . cetirizine (ZYRTEC) 10 MG tablet Take 2 tablets (20 mg total) by mouth 2 (two) times daily. (Patient not taking: Reported on 07/03/2020) 120 tablet 0 Not Taking at Unknown time  . famotidine (PEPCID) 20 MG tablet Take 1 tablet (20 mg total) by mouth 2 (two) times daily. (Patient not taking: Reported on 07/03/2020) 60 tablet 5 Not Taking at Unknown time  . hydrOXYzine (ATARAX/VISTARIL) 10 MG tablet Take 1 tablet (10 mg total) by mouth daily as needed for itching. (Patient not taking: Reported on 07/03/2020) 30 tablet 5 Not Taking at Unknown time  . montelukast (SINGULAIR) 10 MG tablet Take 1 tablet (10 mg total) by mouth at bedtime. (Patient not taking: Reported on 07/03/2020) 30 tablet 5 Not Taking at Unknown time  . triamcinolone cream (KENALOG) 0.1 % Apply 1 application topically 2 (two) times daily as needed. Below neck and NOT on face. (Patient not taking: Reported on 07/03/2020) 45 g 1 Not Taking at Unknown time   Allergies  Allergen Reactions  . Oxycodone Nausea Only and Other (See Comments)    Sweats, nausea and low BP      Social History   Tobacco Use  . Smoking status: Never Smoker  . Smokeless tobacco: Never Used  Substance Use Topics  . Alcohol use: Yes    Alcohol/week: 2.0 standard drinks  Types: 2 Glasses of wine per week    Comment: 1-2 glass wine with dinner    Family History  Problem Relation Age of Onset  . Asthma Neg Hx   . Allergic rhinitis Neg Hx   . Immunodeficiency Neg Hx   . Angioedema Neg Hx   . Eczema Neg Hx   . Urticaria Neg Hx      Review of Systems  Constitutional: Negative for chills and fever.  Respiratory: Negative for cough and shortness of breath.   Cardiovascular: Negative for chest pain.  Gastrointestinal: Negative for nausea and vomiting.  Musculoskeletal: Positive for arthralgias.    Objective:  Physical Exam Patient is a 74 year old male.  Well nourished and well  developed. General: Alert and oriented x3, cooperative and pleasant, no acute distress. Head: normocephalic, atraumatic, neck supple. Eyes: EOMI. Respiratory: breath sounds clear in all fields, no wheezing, rales, or rhonchi. Cardiovascular: Regular rate and rhythm, no murmurs, gallops or rubs. Abdomen: non-tender to palpation and soft, normoactive bowel sounds.  Musculoskeletal: Left Hip Exam: The range of motion: Flexion to 110 degrees, Internal Rotation to 20 degrees, External Rotation to 30 degrees, and abduction to 30 degrees without discomfort. Which is less than the contralateral side. There is no tenderness over the greater trochanteric bursa. Calves soft and nontender. Motor function intact in LE. Strength 5/5 LE bilaterally. Neuro: Distal pulses 2+. Sensation to light touch intact in LE.  Vital signs in last 24 hours:    Labs:   Estimated body mass index is 25.8 kg/m as calculated from the following:   Height as of 07/17/20: 6' (1.829 m).   Weight as of 07/17/20: 86.3 kg.   Imaging Review Plain radiographs demonstrate severe degenerative joint disease of the left hip(s). The bone quality appears to be adequate for age and reported activity level.  Assessment/Plan:  End stage arthritis, left hip(s)  The patient history, physical examination, clinical judgement of the provider and imaging studies are consistent with end stage degenerative joint disease of the left hip(s) and total hip arthroplasty is deemed medically necessary. The treatment options including medical management, injection therapy, arthroscopy and arthroplasty were discussed at length. The risks and benefits of total hip arthroplasty were presented and reviewed. The risks due to aseptic loosening, infection, stiffness, dislocation/subluxation,  thromboembolic complications and other imponderables were discussed.  The patient acknowledged the explanation, agreed to proceed with the plan and consent was signed.  Patient is being admitted for inpatient treatment for surgery, pain control, PT, OT, prophylactic antibiotics, VTE prophylaxis, progressive ambulation and ADL's and discharge planning.The patient is planning to be discharged home.  Therapy Plans: HEP Disposition: Home with Planned DVT Prophylaxis: aspirin 325mg  BID DME needed: none PCP: Dr. Cardiologist: Referred to cardiologist due to arrhythmia TXA: IV Allergies: Oxycodone - significant hypotension Anesthesia Concerns: none BMI: 26.4 Not diabetic.  Other: Arrhythmia noted on PCP visit, stress test on friday 8/13. Did not require any pain medication with right THA, may send small amount of Tramadol.   - Patient was instructed on what medications to stop prior to surgery. - Follow-up visit in 2 weeks with Dr. 04-05-1991 - Begin physical therapy following surgery - Pre-operative lab work as pre-surgical testing - Prescriptions will be provided in hospital at time of discharge  Lequita Halt, PA-C Orthopedic Surgery [facility] [place] [phone]

## 2020-07-25 ENCOUNTER — Ambulatory Visit (HOSPITAL_COMMUNITY): Payer: Medicare PPO | Admitting: Certified Registered Nurse Anesthetist

## 2020-07-25 ENCOUNTER — Ambulatory Visit (HOSPITAL_COMMUNITY): Payer: Medicare PPO | Admitting: Physician Assistant

## 2020-07-25 ENCOUNTER — Ambulatory Visit (HOSPITAL_COMMUNITY): Payer: Medicare PPO

## 2020-07-25 ENCOUNTER — Encounter (HOSPITAL_COMMUNITY)
Admission: RE | Disposition: A | Discharge status: Home / Self Care | Payer: Self-pay | Source: Ambulatory Visit | Attending: Orthopedic Surgery

## 2020-07-25 ENCOUNTER — Encounter (HOSPITAL_COMMUNITY): Payer: Self-pay | Admitting: Orthopedic Surgery

## 2020-07-25 ENCOUNTER — Ambulatory Visit (HOSPITAL_COMMUNITY)
Admission: RE | Admit: 2020-07-25 | Discharge: 2020-07-26 | Disposition: A | Discharge status: Home / Self Care | Payer: Medicare PPO | Source: Ambulatory Visit | Attending: Orthopedic Surgery | Admitting: Orthopedic Surgery

## 2020-07-25 ENCOUNTER — Other Ambulatory Visit: Payer: Self-pay

## 2020-07-25 DIAGNOSIS — M169 Osteoarthritis of hip, unspecified: Secondary | ICD-10-CM | POA: Diagnosis present

## 2020-07-25 DIAGNOSIS — R7303 Prediabetes: Secondary | ICD-10-CM | POA: Diagnosis not present

## 2020-07-25 DIAGNOSIS — M1612 Unilateral primary osteoarthritis, left hip: Secondary | ICD-10-CM | POA: Diagnosis not present

## 2020-07-25 DIAGNOSIS — Z96649 Presence of unspecified artificial hip joint: Secondary | ICD-10-CM

## 2020-07-25 DIAGNOSIS — Z419 Encounter for procedure for purposes other than remedying health state, unspecified: Secondary | ICD-10-CM

## 2020-07-25 DIAGNOSIS — Z96641 Presence of right artificial hip joint: Secondary | ICD-10-CM | POA: Diagnosis not present

## 2020-07-25 DIAGNOSIS — J449 Chronic obstructive pulmonary disease, unspecified: Secondary | ICD-10-CM | POA: Diagnosis not present

## 2020-07-25 HISTORY — PX: TOTAL HIP ARTHROPLASTY: SHX124

## 2020-07-25 LAB — TYPE AND SCREEN
ABO/RH(D): O POS
Antibody Screen: NEGATIVE

## 2020-07-25 LAB — ABO/RH: ABO/RH(D): O POS

## 2020-07-25 SURGERY — ARTHROPLASTY, HIP, TOTAL, ANTERIOR APPROACH
Anesthesia: Monitor Anesthesia Care | Site: Hip | Laterality: Left

## 2020-07-25 MED ORDER — ONDANSETRON HCL 4 MG/2ML IJ SOLN
4.0000 mg | Freq: Four times a day (QID) | INTRAMUSCULAR | Status: DC | PRN
  Filled 2020-07-25: qty 2

## 2020-07-25 MED ORDER — PROPOFOL 10 MG/ML IV BOLUS
INTRAVENOUS | Status: AC
  Filled 2020-07-25: qty 20

## 2020-07-25 MED ORDER — METHOCARBAMOL 500 MG IVPB - SIMPLE MED
500.0000 mg | Freq: Four times a day (QID) | INTRAVENOUS | Status: DC | PRN
  Administered 2020-07-25: 500 mg via INTRAVENOUS
  Filled 2020-07-25: qty 50

## 2020-07-25 MED ORDER — MENTHOL 3 MG MT LOZG: 1.0000 | LOZENGE | OROMUCOSAL | Status: DC | PRN

## 2020-07-25 MED ORDER — DOCUSATE SODIUM 100 MG PO CAPS
100.0000 mg | ORAL_CAPSULE | Freq: Two times a day (BID) | ORAL | Status: DC
  Administered 2020-07-25 – 2020-07-26 (×2): 100 mg via ORAL
  Filled 2020-07-25 (×2): qty 1

## 2020-07-25 MED ORDER — POLYETHYLENE GLYCOL 3350 17 G PO PACK: 17.0000 g | PACK | Freq: Every day | ORAL | Status: DC | PRN

## 2020-07-25 MED ORDER — METOCLOPRAMIDE HCL 5 MG PO TABS: 5.0000 mg | ORAL_TABLET | Freq: Three times a day (TID) | ORAL | Status: DC | PRN

## 2020-07-25 MED ORDER — ONDANSETRON HCL 4 MG/2ML IJ SOLN
INTRAMUSCULAR | Status: DC | PRN
  Administered 2020-07-25: 4 mg via INTRAVENOUS

## 2020-07-25 MED ORDER — PHENYLEPHRINE HCL (PRESSORS) 10 MG/ML IV SOLN
INTRAVENOUS | Status: AC
  Filled 2020-07-25: qty 1

## 2020-07-25 MED ORDER — ACETAMINOPHEN 10 MG/ML IV SOLN
INTRAVENOUS | Status: AC
  Filled 2020-07-25: qty 100

## 2020-07-25 MED ORDER — METOCLOPRAMIDE HCL 5 MG/ML IJ SOLN
5.0000 mg | Freq: Three times a day (TID) | INTRAMUSCULAR | Status: DC | PRN
  Administered 2020-07-26: 10 mg via INTRAVENOUS
  Filled 2020-07-25: qty 2

## 2020-07-25 MED ORDER — ALBUMIN HUMAN 5 % IV SOLN
INTRAVENOUS | Status: AC
  Administered 2020-07-25: 12.5 g via INTRAVENOUS
  Filled 2020-07-25: qty 250

## 2020-07-25 MED ORDER — ACETAMINOPHEN 325 MG PO TABS: 325.0000 mg | ORAL_TABLET | Freq: Four times a day (QID) | ORAL | Status: DC | PRN

## 2020-07-25 MED ORDER — DEXAMETHASONE SODIUM PHOSPHATE 10 MG/ML IJ SOLN
10.0000 mg | Freq: Once | INTRAMUSCULAR | Status: AC
  Administered 2020-07-26: 10 mg via INTRAVENOUS
  Filled 2020-07-25: qty 1

## 2020-07-25 MED ORDER — BUPIVACAINE IN DEXTROSE 0.75-8.25 % IT SOLN
INTRATHECAL | Status: DC | PRN
  Administered 2020-07-25: 1.8 mL via INTRATHECAL

## 2020-07-25 MED ORDER — WATER FOR IRRIGATION, STERILE IR SOLN
Status: DC | PRN
  Administered 2020-07-25: 2000 mL

## 2020-07-25 MED ORDER — PROPOFOL 500 MG/50ML IV EMUL
INTRAVENOUS | Status: AC
  Filled 2020-07-25: qty 50

## 2020-07-25 MED ORDER — DEXAMETHASONE SODIUM PHOSPHATE 10 MG/ML IJ SOLN
INTRAMUSCULAR | Status: AC
  Filled 2020-07-25: qty 1

## 2020-07-25 MED ORDER — HYDROCODONE-ACETAMINOPHEN 5-325 MG PO TABS
1.0000 | ORAL_TABLET | ORAL | Status: DC | PRN
  Administered 2020-07-25 – 2020-07-26 (×4): 2 via ORAL
  Filled 2020-07-25 (×4): qty 2

## 2020-07-25 MED ORDER — 0.9 % SODIUM CHLORIDE (POUR BTL) OPTIME
TOPICAL | Status: DC | PRN
  Administered 2020-07-25: 1000 mL

## 2020-07-25 MED ORDER — FENTANYL CITRATE (PF) 100 MCG/2ML IJ SOLN
INTRAMUSCULAR | Status: AC
  Administered 2020-07-25: 50 ug via INTRAVENOUS
  Filled 2020-07-25: qty 2

## 2020-07-25 MED ORDER — PHENOL 1.4 % MT LIQD: 1.0000 | OROMUCOSAL | Status: DC | PRN

## 2020-07-25 MED ORDER — MORPHINE SULFATE (PF) 2 MG/ML IV SOLN: 0.5000 mg | INTRAVENOUS | Status: DC | PRN

## 2020-07-25 MED ORDER — PHENYLEPHRINE HCL-NACL 10-0.9 MG/250ML-% IV SOLN
INTRAVENOUS | Status: DC | PRN
  Administered 2020-07-25: 20 ug/min via INTRAVENOUS

## 2020-07-25 MED ORDER — CEFAZOLIN SODIUM-DEXTROSE 2-4 GM/100ML-% IV SOLN
2.0000 g | INTRAVENOUS | Status: AC
  Administered 2020-07-25: 2 g via INTRAVENOUS
  Filled 2020-07-25: qty 100

## 2020-07-25 MED ORDER — BUPIVACAINE HCL 0.25 % IJ SOLN
INTRAMUSCULAR | Status: AC
  Filled 2020-07-25: qty 1

## 2020-07-25 MED ORDER — FENTANYL CITRATE (PF) 100 MCG/2ML IJ SOLN
25.0000 ug | INTRAMUSCULAR | Status: DC | PRN
  Administered 2020-07-25: 50 ug via INTRAVENOUS

## 2020-07-25 MED ORDER — ACETAMINOPHEN 160 MG/5ML PO SOLN: 1000.0000 mg | Freq: Once | ORAL | Status: DC | PRN

## 2020-07-25 MED ORDER — ORAL CARE MOUTH RINSE: 15.0000 mL | Freq: Once | OROMUCOSAL | Status: AC

## 2020-07-25 MED ORDER — FENTANYL CITRATE (PF) 250 MCG/5ML IJ SOLN
INTRAMUSCULAR | Status: AC
  Filled 2020-07-25: qty 5

## 2020-07-25 MED ORDER — LACTATED RINGERS IV SOLN: INTRAVENOUS | Status: DC

## 2020-07-25 MED ORDER — TRANEXAMIC ACID-NACL 1000-0.7 MG/100ML-% IV SOLN
1000.0000 mg | INTRAVENOUS | Status: AC
  Administered 2020-07-25: 1000 mg via INTRAVENOUS
  Filled 2020-07-25: qty 100

## 2020-07-25 MED ORDER — ONDANSETRON HCL 4 MG PO TABS: 4.0000 mg | ORAL_TABLET | Freq: Four times a day (QID) | ORAL | Status: DC | PRN

## 2020-07-25 MED ORDER — CHLORHEXIDINE GLUCONATE 0.12 % MT SOLN
15.0000 mL | Freq: Once | OROMUCOSAL | Status: AC
  Administered 2020-07-25: 15 mL via OROMUCOSAL

## 2020-07-25 MED ORDER — BUPIVACAINE HCL 0.25 % IJ SOLN
INTRAMUSCULAR | Status: DC | PRN
  Administered 2020-07-25: 30 mL

## 2020-07-25 MED ORDER — LIDOCAINE 2% (20 MG/ML) 5 ML SYRINGE
INTRAMUSCULAR | Status: AC
  Filled 2020-07-25: qty 5

## 2020-07-25 MED ORDER — CEFAZOLIN SODIUM-DEXTROSE 2-4 GM/100ML-% IV SOLN
2.0000 g | Freq: Four times a day (QID) | INTRAVENOUS | Status: AC
  Administered 2020-07-25 – 2020-07-26 (×2): 2 g via INTRAVENOUS
  Filled 2020-07-25 (×2): qty 100

## 2020-07-25 MED ORDER — BISACODYL 10 MG RE SUPP: 10.0000 mg | Freq: Every day | RECTAL | Status: DC | PRN

## 2020-07-25 MED ORDER — ONDANSETRON HCL 4 MG/2ML IJ SOLN
INTRAMUSCULAR | Status: AC
  Filled 2020-07-25: qty 2

## 2020-07-25 MED ORDER — ACETAMINOPHEN 500 MG PO TABS: 1000.0000 mg | ORAL_TABLET | Freq: Once | ORAL | Status: DC | PRN

## 2020-07-25 MED ORDER — POVIDONE-IODINE 10 % EX SWAB
2.0000 "application " | Freq: Once | CUTANEOUS | Status: AC
  Administered 2020-07-25: 2 via TOPICAL

## 2020-07-25 MED ORDER — PROPOFOL 10 MG/ML IV BOLUS
INTRAVENOUS | Status: DC | PRN
  Administered 2020-07-25 (×7): 20 mg via INTRAVENOUS

## 2020-07-25 MED ORDER — SODIUM CHLORIDE 0.9 % IV SOLN: INTRAVENOUS | Status: DC

## 2020-07-25 MED ORDER — ACETAMINOPHEN 10 MG/ML IV SOLN
1000.0000 mg | Freq: Once | INTRAVENOUS | Status: AC
  Administered 2020-07-25: 1000 mg via INTRAVENOUS
  Filled 2020-07-25: qty 100

## 2020-07-25 MED ORDER — TAMSULOSIN HCL 0.4 MG PO CAPS
0.4000 mg | ORAL_CAPSULE | Freq: Every day | ORAL | Status: DC
  Administered 2020-07-26: 0.4 mg via ORAL
  Filled 2020-07-25: qty 1

## 2020-07-25 MED ORDER — METHOCARBAMOL 500 MG IVPB - SIMPLE MED
INTRAVENOUS | Status: AC
  Filled 2020-07-25: qty 50

## 2020-07-25 MED ORDER — ALBUMIN HUMAN 5 % IV SOLN: 12.5000 g | Freq: Once | INTRAVENOUS | Status: AC

## 2020-07-25 MED ORDER — HYDROCODONE-ACETAMINOPHEN 7.5-325 MG PO TABS: 1.0000 | ORAL_TABLET | Freq: Once | ORAL | Status: DC | PRN

## 2020-07-25 MED ORDER — ASPIRIN EC 325 MG PO TBEC
325.0000 mg | DELAYED_RELEASE_TABLET | Freq: Two times a day (BID) | ORAL | Status: DC
  Administered 2020-07-26: 325 mg via ORAL
  Filled 2020-07-25: qty 1

## 2020-07-25 MED ORDER — FENTANYL CITRATE (PF) 100 MCG/2ML IJ SOLN
INTRAMUSCULAR | Status: DC | PRN
Start: 2020-07-25 — End: 2020-07-25
  Administered 2020-07-25 (×5): 50 ug via INTRAVENOUS

## 2020-07-25 MED ORDER — METHOCARBAMOL 500 MG PO TABS
500.0000 mg | ORAL_TABLET | Freq: Four times a day (QID) | ORAL | Status: DC | PRN
  Administered 2020-07-25 – 2020-07-26 (×2): 500 mg via ORAL
  Filled 2020-07-25 (×2): qty 1

## 2020-07-25 MED ORDER — MAGNESIUM CITRATE PO SOLN: 1.0000 | Freq: Once | ORAL | Status: DC | PRN

## 2020-07-25 MED ORDER — ROCURONIUM BROMIDE 10 MG/ML (PF) SYRINGE
PREFILLED_SYRINGE | INTRAVENOUS | Status: AC
  Filled 2020-07-25: qty 10

## 2020-07-25 MED ORDER — ACETAMINOPHEN 10 MG/ML IV SOLN: 1000.0000 mg | Freq: Once | INTRAVENOUS | Status: DC | PRN

## 2020-07-25 MED ORDER — TRAMADOL HCL 50 MG PO TABS: 50.0000 mg | ORAL_TABLET | Freq: Four times a day (QID) | ORAL | Status: DC | PRN

## 2020-07-25 MED ORDER — PROPOFOL 500 MG/50ML IV EMUL
INTRAVENOUS | Status: DC | PRN
  Administered 2020-07-25: 100 ug/kg/min via INTRAVENOUS

## 2020-07-25 MED ORDER — DEXAMETHASONE SODIUM PHOSPHATE 10 MG/ML IJ SOLN
8.0000 mg | Freq: Once | INTRAMUSCULAR | Status: AC
  Administered 2020-07-25: 8 mg via INTRAVENOUS

## 2020-07-25 SURGICAL SUPPLY — 45 items
BAG DECANTER FOR FLEXI CONT (MISCELLANEOUS) IMPLANT
BAG ZIPLOCK 12X15 (MISCELLANEOUS) IMPLANT
BLADE SAG 18X100X1.27 (BLADE) ×2 IMPLANT
COVER PERINEAL POST (MISCELLANEOUS) ×2 IMPLANT
COVER SURGICAL LIGHT HANDLE (MISCELLANEOUS) ×2 IMPLANT
COVER WAND RF STERILE (DRAPES) IMPLANT
CUP ACETBLR 52 OD PINNACLE (Hips) ×2 IMPLANT
DECANTER SPIKE VIAL GLASS SM (MISCELLANEOUS) ×2 IMPLANT
DRAPE STERI IOBAN 125X83 (DRAPES) ×2 IMPLANT
DRAPE U-SHAPE 47X51 STRL (DRAPES) ×4 IMPLANT
DRSG ADAPTIC 3X8 NADH LF (GAUZE/BANDAGES/DRESSINGS) ×2 IMPLANT
DRSG AQUACEL AG ADV 3.5X10 (GAUZE/BANDAGES/DRESSINGS) ×2 IMPLANT
DURAPREP 26ML APPLICATOR (WOUND CARE) ×2 IMPLANT
ELECT REM PT RETURN 15FT ADLT (MISCELLANEOUS) ×2 IMPLANT
EVACUATOR 1/8 PVC DRAIN (DRAIN) IMPLANT
GLOVE BIO SURGEON STRL SZ 6 (GLOVE) ×2 IMPLANT
GLOVE BIO SURGEON STRL SZ7 (GLOVE) IMPLANT
GLOVE BIO SURGEON STRL SZ8 (GLOVE) ×2 IMPLANT
GLOVE BIOGEL PI IND STRL 6.5 (GLOVE) ×1 IMPLANT
GLOVE BIOGEL PI IND STRL 7.0 (GLOVE) IMPLANT
GLOVE BIOGEL PI IND STRL 8 (GLOVE) ×1 IMPLANT
GLOVE BIOGEL PI INDICATOR 6.5 (GLOVE) ×1
GLOVE BIOGEL PI INDICATOR 7.0 (GLOVE)
GLOVE BIOGEL PI INDICATOR 8 (GLOVE) ×1
GOWN STRL REUS W/TWL LRG LVL3 (GOWN DISPOSABLE) ×2 IMPLANT
GOWN STRL REUS W/TWL XL LVL3 (GOWN DISPOSABLE) ×2 IMPLANT
HEAD FEMORAL 32 CERAMIC (Hips) ×2 IMPLANT
HOLDER FOLEY CATH W/STRAP (MISCELLANEOUS) ×2 IMPLANT
KIT TURNOVER KIT A (KITS) IMPLANT
LINER MARATHON NEUT +4X52X32 (Hips) ×2 IMPLANT
MANIFOLD NEPTUNE II (INSTRUMENTS) ×2 IMPLANT
PACK ANTERIOR HIP CUSTOM (KITS) ×2 IMPLANT
PENCIL SMOKE EVACUATOR COATED (MISCELLANEOUS) ×2 IMPLANT
STEM FEM ACTIS HIGH SZ7 (Stem) ×2 IMPLANT
STRIP CLOSURE SKIN 1/2X4 (GAUZE/BANDAGES/DRESSINGS) ×4 IMPLANT
SUT ETHIBOND NAB CT1 #1 30IN (SUTURE) ×2 IMPLANT
SUT MNCRL AB 4-0 PS2 18 (SUTURE) ×2 IMPLANT
SUT STRATAFIX 0 PDS 27 VIOLET (SUTURE) ×2
SUT VIC AB 2-0 CT1 27 (SUTURE) ×2
SUT VIC AB 2-0 CT1 TAPERPNT 27 (SUTURE) ×2 IMPLANT
SUTURE STRATFX 0 PDS 27 VIOLET (SUTURE) ×1 IMPLANT
SYR 50ML LL SCALE MARK (SYRINGE) IMPLANT
SYR BULB IRRIG 60ML STRL (SYRINGE) ×2 IMPLANT
TRAY FOLEY MTR SLVR 16FR STAT (SET/KITS/TRAYS/PACK) ×2 IMPLANT
YANKAUER SUCT BULB TIP 10FT TU (MISCELLANEOUS) ×2 IMPLANT

## 2020-07-25 NOTE — Transfer of Care (Signed)
Immediate Anesthesia Transfer of Care Note  Patient: Stephen Reilly  Procedure(s) Performed: TOTAL HIP ARTHROPLASTY ANTERIOR APPROACH (Left Hip)  Patient Location: PACU  Anesthesia Type:Spinal  Level of Consciousness: drowsy and patient cooperative  Airway & Oxygen Therapy: Patient Spontanous Breathing and Patient connected to face mask oxygen  Post-op Assessment: Report given to RN and Post -op Vital signs reviewed and stable  Post vital signs: Reviewed and stable  Last Vitals:  Vitals Value Taken Time  BP    Temp    Pulse    Resp    SpO2      Last Pain:  Vitals:   07/25/20 1225  TempSrc:   PainSc: 0-No pain         Complications: No complications documented.

## 2020-07-25 NOTE — Op Note (Signed)
OPERATIVE REPORT- TOTAL HIP ARTHROPLASTY   PREOPERATIVE DIAGNOSIS: Osteoarthritis of the Left hip.   POSTOPERATIVE DIAGNOSIS: Osteoarthritis of the Left  hip.   PROCEDURE: Left total hip arthroplasty, anterior approach.   SURGEON: Ollen Gross, MD   ASSISTANT: Dennie Bible, PA-C  ANESTHESIA:  Spinal  ESTIMATED BLOOD LOSS:-500 mL    DRAINS: Hemovac x1.   COMPLICATIONS: None   CONDITION: PACU - hemodynamically stable.   BRIEF CLINICAL NOTE: Stephen Reilly is a 74 y.o. male who has advanced end-  stage arthritis of their Left  hip with progressively worsening pain and  dysfunction.The patient has failed nonoperative management and presents for  total hip arthroplasty.   PROCEDURE IN DETAIL: After successful administration of spinal  anesthetic, the traction boots for the [facility] bed were placed on both  feet and the patient was placed onto the [facility] bed, boots placed into the leg  holders. The Left hip was then isolated from the perineum with plastic  drapes and prepped and draped in the usual sterile fashion. ASIS and  greater trochanter were marked and a oblique incision was made, starting  at about 1 cm lateral and 2 cm distal to the ASIS and coursing towards  the anterior cortex of the femur. The skin was cut with a 10 blade  through subcutaneous tissue to the level of the fascia overlying the  tensor fascia lata muscle. The fascia was then incised in line with the  incision at the junction of the anterior third and posterior 2/3rd. The  muscle was teased off the fascia and then the interval between the TFL  and the rectus was developed. The Hohmann retractor was then placed at  the top of the femoral neck over the capsule. The vessels overlying the  capsule were cauterized and the fat on top of the capsule was removed.  A Hohmann retractor was then placed anterior underneath the rectus  femoris to give exposure to the entire anterior capsule. A T-shaped  capsulotomy  was performed. The edges were tagged and the femoral head  was identified.       Osteophytes are removed off the superior acetabulum.  The femoral neck was then cut in situ with an oscillating saw. Traction  was then applied to the left lower extremity utilizing the [facility]  traction. The femoral head was then removed. Retractors were placed  around the acetabulum and then circumferential removal of the labrum was  performed. Osteophytes were also removed. Reaming starts at 49 mm to  medialize and  Increased in 2 mm increments to 51 mm. We reamed in  approximately 40 degrees of abduction, 20 degrees anteversion. A 52 mm  pinnacle acetabular shell was then impacted in anatomic position under  fluoroscopic guidance with excellent purchase. We did not need to place  any additional dome screws. A 32 mm neutral + 4 marathon liner was then  placed into the acetabular shell.       The femoral lift was then placed along the lateral aspect of the femur  just distal to the vastus ridge. The leg was  externally rotated and capsule  was stripped off the inferior aspect of the femoral neck down to the  level of the lesser trochanter, this was done with electrocautery. The femur was lifted after this was performed. The  leg was then placed in an extended and adducted position essentially delivering the femur. We also removed the capsule superiorly and the piriformis from the piriformis fossa  to gain excellent exposure of the  proximal femur. Rongeur was used to remove some cancellous bone to get  into the lateral portion of the proximal femur for placement of the  initial starter reamer. The starter broaches was placed  the starter broach  and was shown to go down the center of the canal. Broaching  with the Actis system was then performed starting at size 0  coursing  Up to size 7. A size 7 had excellent torsional and rotational  and axial stability. The trial high offset neck was then placed  with a 32 + 1  trial head. The hip was then reduced. We confirmed that  the stem was in the canal both on AP and lateral x-rays. It also has excellent sizing. The hip was reduced with outstanding stability through full extension and full external rotation.. AP pelvis was taken and the leg lengths were measured and found to be equal. Hip was then dislocated again and the femoral head and neck removed. The  femoral broach was removed. Size 7 Actis stem with a high offset  neck was then impacted into the femur following native anteversion. Has  excellent purchase in the canal. Excellent torsional and rotational and  axial stability. It is confirmed to be in the canal on AP and lateral  fluoroscopic views. The 32 + 1 ceramic head was placed and the hip  reduced with outstanding stability. Again AP pelvis was taken and it  confirmed that the leg lengths were equal. The wound was then copiously  irrigated with saline solution and the capsule reattached and repaired  with Ethibond suture. 30 ml of .25% Bupivicaine was  injected into the capsule and into the edge of the tensor fascia lata as well as subcutaneous tissue. The fascia overlying the tensor fascia lata was then closed with a running #1 V-Loc. Subcu was closed with interrupted 2-0 Vicryl and subcuticular running 4-0 Monocryl. Incision was cleaned  and dried. Steri-Strips and a bulky sterile dressing applied. Hemovac  drain was hooked to suction and then the patient was awakened and transported to  recovery in stable condition.        Please note that a surgical assistant was a medical necessity for this procedure to perform it in a safe and expeditious manner. Assistant was necessary to provide appropriate retraction of vital neurovascular structures and to prevent femoral fracture and allow for anatomic placement of the prosthesis.  Ollen Gross, M.D.

## 2020-07-25 NOTE — Interval H&P Note (Signed)
History and Physical Interval Note:  07/25/2020 12:29 PM  Stephen Reilly  has presented today for surgery, with the diagnosis of left hip osteoarthritis.  The various methods of treatment have been discussed with the patient and family. After consideration of risks, benefits and other options for treatment, the patient has consented to  Procedure(s) with comments: TOTAL HIP ARTHROPLASTY ANTERIOR APPROACH (Left) - as a surgical intervention.  The patient's history has been reviewed, patient examined, no change in status, stable for surgery.  I have reviewed the patient's chart and labs.  Questions were answered to the patient's satisfaction.     Homero Fellers Quanda Pavlicek

## 2020-07-25 NOTE — Discharge Instructions (Addendum)
Frank Aluisio, MD Total Joint Specialist [facility] [address] [place], [place] [address] [phone]  ANTERIOR APPROACH TOTAL HIP REPLACEMENT POSTOPERATIVE DIRECTIONS     Hip Rehabilitation, Guidelines Following Surgery  The results of a hip operation are greatly improved after range of motion and muscle strengthening exercises. Follow all safety measures which are given to protect your hip. If any of these exercises cause increased pain or swelling in your joint, decrease the amount until you are comfortable again. Then slowly increase the exercises. Call your caregiver if you have problems or questions.   BLOOD CLOT PREVENTION . Take a 325 mg Aspirin two times a day for three weeks following surgery. Then take an 81 mg Aspirin once a day for three weeks. Then discontinue Aspirin. . You may resume your vitamins/supplements upon discharge from the hospital. . Do not take any NSAIDs (Advil, Aleve, Ibuprofen, Meloxicam, etc.) until you have discontinued the 325 mg Aspirin.  HOME CARE INSTRUCTIONS  . Remove items at home which could result in a fall. This includes throw rugs or furniture in walking pathways.   ICE to the affected hip as frequently as 20-30 minutes an hour and then as needed for pain and swelling. Continue to use ice on the hip for pain and swelling from surgery. You may notice swelling that will progress down to the foot and ankle. This is normal after surgery. Elevate the leg when you are not up walking on it.    Continue to use the breathing machine which will help keep your temperature down.  It is common for your temperature to cycle up and down following surgery, especially at night when you are not up moving around and exerting yourself.  The breathing machine keeps your lungs expanded and your temperature down.  DIET You may resume your previous home diet once your are discharged from the hospital.  DRESSING / WOUND CARE /  SHOWERING . You have an adhesive waterproof bandage over the incision. Leave this in place until your first follow-up appointment. Once you remove this you will not need to place another bandage.  . You may begin showering 3 days following surgery, but do not submerge the incision under water.  ACTIVITY . For the first 3-5 days, it is important to rest and keep the operative leg elevated. You should, as a general rule, rest for 50 minutes and walk/stretch for 10 minutes per hour. After 5 days, you may slowly increase activity as tolerated.  . Perform the exercises you were provided twice a day for about 15-20 minutes each session. Begin these 2 days following surgery. . Walk with your walker as instructed. Use the walker until you are comfortable transitioning to a cane. Walk with the cane in the opposite hand of the operative leg. You may discontinue the cane once you are comfortable and walking steadily. . Avoid periods of inactivity such as sitting longer than an hour when not asleep. This helps prevent blood clots.  . Do not drive a car for 6 weeks or until released by your surgeon.  . Do not drive while taking narcotics.  TED HOSE STOCKINGS Wear the elastic stockings on both legs for three weeks following surgery during the day. You may remove them at night while sleeping.  WEIGHT BEARING Weight bearing as tolerated with assist device (walker, cane, etc) as directed, use it as long as suggested by your surgeon or therapist, typically at least 4-6 weeks.  POSTOPERATIVE CONSTIPATION PROTOCOL Constipation -   defined medically as fewer than three stools per week and severe constipation as less than one stool per week.  One of the most common issues patients have following surgery is constipation.  Even if you have a regular bowel pattern at home, your normal regimen is likely to be disrupted due to multiple reasons following surgery.  Combination of anesthesia, postoperative narcotics, change in  appetite and fluid intake all can affect your bowels.  In order to avoid complications following surgery, here are some recommendations in order to help you during your recovery period.  . Colace (docusate) - Pick up an over-the-counter form of Colace or another stool softener and take twice a day as long as you are requiring postoperative pain medications.  Take with a full glass of water daily.  If you experience loose stools or diarrhea, hold the colace until you stool forms back up.  If your symptoms do not get better within 1 week or if they get worse, check with your doctor. . Dulcolax (bisacodyl) - Pick up over-the-counter and take as directed by the product packaging as needed to assist with the movement of your bowels.  Take with a full glass of water.  Use this product as needed if not relieved by Colace only.  . MiraLax (polyethylene glycol) - Pick up over-the-counter to have on hand.  MiraLax is a solution that will increase the amount of water in your bowels to assist with bowel movements.  Take as directed and can mix with a glass of water, juice, soda, coffee, or tea.  Take if you go more than two days without a movement.Do not use MiraLax more than once per day. Call your doctor if you are still constipated or irregular after using this medication for 7 days in a row.  If you continue to have problems with postoperative constipation, please contact the office for further assistance and recommendations.  If you experience "the worst abdominal pain ever" or develop nausea or vomiting, please contact the office immediatly for further recommendations for treatment.  ITCHING  If you experience itching with your medications, try taking only a single pain pill, or even half a pain pill at a time.  You can also use Benadryl over the counter for itching or also to help with sleep.   MEDICATIONS See your medication summary on the "After Visit Summary" that the nursing staff will review with you  prior to discharge.  You may have some home medications which will be placed on hold until you complete the course of blood thinner medication.  It is important for you to complete the blood thinner medication as prescribed by your surgeon.  Continue your approved medications as instructed at time of discharge.  PRECAUTIONS If you experience chest pain or shortness of breath - call 911 immediately for transfer to the hospital emergency department.  If you develop a fever greater that 101 F, purulent drainage from wound, increased redness or drainage from wound, foul odor from the wound/dressing, or calf pain - CONTACT YOUR SURGEON.                                                   FOLLOW-UP APPOINTMENTS Make sure you keep all of your appointments after your operation with your surgeon and caregivers. You should call the office at the above phone number and   make an appointment for approximately two weeks after the date of your surgery or on the date instructed by your surgeon outlined in the "After Visit Summary".  RANGE OF MOTION AND STRENGTHENING EXERCISES  These exercises are designed to help you keep full movement of your hip joint. Follow your caregiver's or physical therapist's instructions. Perform all exercises about fifteen times, three times per day or as directed. Exercise both hips, even if you have had only one joint replacement. These exercises can be done on a training (exercise) mat, on the floor, on a table or on a bed. Use whatever works the best and is most comfortable for you. Use music or television while you are exercising so that the exercises are a pleasant break in your day. This will make your life better with the exercises acting as a break in routine you can look forward to.  . Lying on your back, slowly slide your foot toward your buttocks, raising your knee up off the floor. Then slowly slide your foot back down until your leg is straight again.  . Lying on your back spread  your legs as far apart as you can without causing discomfort.  . Lying on your side, raise your upper leg and foot straight up from the floor as far as is comfortable. Slowly lower the leg and repeat.  . Lying on your back, tighten up the muscle in the front of your thigh (quadriceps muscles). You can do this by keeping your leg straight and trying to raise your heel off the floor. This helps strengthen the largest muscle supporting your knee.  . Lying on your back, tighten up the muscles of your buttocks both with the legs straight and with the knee bent at a comfortable angle while keeping your heel on the floor.   IF YOU ARE TRANSFERRED TO A SKILLED REHAB FACILITY If the patient is transferred to a skilled rehab facility following release from the hospital, a list of the current medications will be sent to the facility for the patient to continue.  When discharged from the skilled rehab facility, please have the facility set up the patient's Home Health Physical Therapy prior to being released. Also, the skilled facility will be responsible for providing the patient with their medications at time of release from the facility to include their pain medication, the muscle relaxants, and their blood thinner medication. If the patient is still at the rehab facility at time of the two week follow up appointment, the skilled rehab facility will also need to assist the patient in arranging follow up appointment in our office and any transportation needs.  MAKE SURE YOU:  . Understand these instructions.  . Get help right away if you are not doing well or get worse.    DENTAL ANTIBIOTICS:  In most cases prophylactic antibiotics for Dental procdeures after total joint surgery are not necessary.  Exceptions are as follows:  1. History of prior total joint infection  2. Severely immunocompromised (Organ Transplant, cancer chemotherapy, Rheumatoid biologic meds such as Humera)  3. Poorly controlled  diabetes (A1C &gt; 8.0, blood glucose over 200)  If you have one of these conditions, contact your surgeon for an antibiotic prescription, prior to your dental procedure.    Pick up stool softner and laxative for home use following surgery while on pain medications. Do not submerge incision under water. Please use good hand washing techniques while changing dressing each day. May shower starting three days after surgery. Please   use a clean towel to pat the incision dry following showers. Continue to use ice for pain and swelling after surgery. Do not use any lotions or creams on the incision until instructed by your surgeon.  

## 2020-07-25 NOTE — Anesthesia Preprocedure Evaluation (Addendum)
Anesthesia Evaluation  Patient identified by MRN, date of birth, ID band Patient awake    Reviewed: Allergy & Precautions, NPO status , Patient's Chart, lab work & pertinent test results  History of Anesthesia Complications Negative for: history of anesthetic complications  Airway Mallampati: II  TM Distance: >3 FB     Dental  (+) Dental Advisory Given   Pulmonary neg shortness of breath, neg sleep apnea, COPD, neg recent URI,    breath sounds clear to auscultation       Cardiovascular negative cardio ROS   Rhythm:Regular     Neuro/Psych  Neuromuscular disease negative psych ROS   GI/Hepatic negative GI ROS, Neg liver ROS,   Endo/Other  negative endocrine ROS  Renal/GU      Musculoskeletal  (+) Arthritis ,   Abdominal   Peds  Hematology Lab Results      Component                Value               Date                      WBC                      5.6                 07/17/2020                HGB                      15.5                07/17/2020                HCT                      44.8                07/17/2020                MCV                      90.7                07/17/2020                PLT                      244                 07/17/2020           Lab Results      Component                Value               Date                      INR                      0.9                 07/17/2020           PTT 26              Anesthesia Other Findings  Reproductive/Obstetrics                            Anesthesia Physical Anesthesia Plan  ASA: II  Anesthesia Plan: MAC and Spinal   Post-op Pain Management:    Induction:   PONV Risk Score and Plan: 1 and Treatment may vary due to age or medical condition and Propofol infusion  Airway Management Planned: Nasal Cannula  Additional Equipment: None  Intra-op Plan:   Post-operative Plan:   Informed  Consent: I have reviewed the patients History and Physical, chart, labs and discussed the procedure including the risks, benefits and alternatives for the proposed anesthesia with the patient or authorized representative who has indicated his/her understanding and acceptance.     Dental advisory given  Plan Discussed with: CRNA and Surgeon  Anesthesia Plan Comments:         Anesthesia Quick Evaluation

## 2020-07-26 ENCOUNTER — Encounter (HOSPITAL_COMMUNITY): Payer: Self-pay | Admitting: Orthopedic Surgery

## 2020-07-26 DIAGNOSIS — M1612 Unilateral primary osteoarthritis, left hip: Secondary | ICD-10-CM | POA: Diagnosis not present

## 2020-07-26 LAB — BASIC METABOLIC PANEL
Anion gap: 11 (ref 5–15)
BUN: 11 mg/dL (ref 8–23)
CO2: 21 mmol/L — ABNORMAL LOW (ref 22–32)
Calcium: 8.2 mg/dL — ABNORMAL LOW (ref 8.9–10.3)
Chloride: 98 mmol/L (ref 98–111)
Creatinine, Ser: 0.73 mg/dL (ref 0.61–1.24)
GFR calc Af Amer: >60 mL/min (ref >60)
GFR calc non Af Amer: >60 mL/min (ref >60)
Glucose, Bld: 195 mg/dL — ABNORMAL HIGH (ref 70–99)
Potassium: 3.8 mmol/L (ref 3.5–5.1)
Sodium: 130 mmol/L — ABNORMAL LOW (ref 135–145)

## 2020-07-26 LAB — CBC
HCT: 36.2 % — ABNORMAL LOW (ref 39.0–52.0)
Hemoglobin: 12.4 g/dL — ABNORMAL LOW (ref 13.0–17.0)
MCH: 31.1 pg (ref 26.0–34.0)
MCHC: 34.3 g/dL (ref 30.0–36.0)
MCV: 90.7 fL (ref 80.0–100.0)
Platelets: 190 10*3/uL (ref 150–400)
RBC: 3.99 MIL/uL — ABNORMAL LOW (ref 4.22–5.81)
RDW: 11.6 % (ref 11.5–15.5)
WBC: 12.6 10*3/uL — ABNORMAL HIGH (ref 4.0–10.5)
nRBC: 0 % (ref 0.0–0.2)

## 2020-07-26 MED ORDER — METHOCARBAMOL 500 MG PO TABS: 500.0000 mg | ORAL_TABLET | Freq: Four times a day (QID) | ORAL | 0 refills | Status: AC | PRN

## 2020-07-26 MED ORDER — ASPIRIN 325 MG PO TBEC: 325.0000 mg | DELAYED_RELEASE_TABLET | Freq: Two times a day (BID) | ORAL | 0 refills | Status: AC

## 2020-07-26 MED ORDER — HYDROCODONE-ACETAMINOPHEN 5-325 MG PO TABS
1.0000 | ORAL_TABLET | Freq: Four times a day (QID) | ORAL | 0 refills | Status: AC | PRN
Start: 2020-07-26 — End: ?

## 2020-07-26 MED ORDER — TRAMADOL HCL 50 MG PO TABS: 50.0000 mg | ORAL_TABLET | Freq: Four times a day (QID) | ORAL | 0 refills | Status: AC | PRN

## 2020-07-26 NOTE — Progress Notes (Signed)
All discharge instructions were given to the Pt. All questions were answered. Discussed pain management. 

## 2020-07-26 NOTE — TOC Transition Note (Signed)
Transition of Care [facility]) - CM/SW Discharge Note   Patient Details  Name: Stephen Reilly MRN: [id number] Date of Birth: Oct 29, 1946  [facility] CM/SW Contact:  Clearance Coots, LCSW Phone Number: 07/26/2020, 9:25 AM   Clinical Narrative:    Therapy Plan: HEP Patient confirm he has a RW and 3 in 1.    Final next level of care: Home/Self Care (HEP) Barriers to Discharge: No Barriers Identified   Patient Goals and CMS Choice     Choice offered to / list presented to : NA  Discharge Placement                       Discharge Plan and Services                DME Arranged: N/A DME Agency: NA       HH Arranged: NA HH Agency: NA        Social Determinants of Health (SDOH) Interventions     Readmission Risk Interventions No flowsheet data found.

## 2020-07-26 NOTE — Evaluation (Signed)
Physical Therapy Evaluation Patient Details Name: Stephen Reilly MRN: [id number] DOB: [birth year] Today's Date: 07/26/2020   History of Present Illness  Pt is 74 yo male s/p L anterior THA on 07/25/20.  Pt with PMH of R THA .  Clinical Impression  Pt is s/p L anteriorTHA resulting in the deficits listed below (see PT Problem List). Pt making excellent progress for POD #1.  He is very motivated and with good pain control.  Pt did have some nausea/vomitting initially but VSS and improved after he vomitted.  Pt was able to ambulate and perform stairs with supervision.  Pt has support at home and necessary DME.  Pt was educated on HEP but cued to focus on walking and only doing exercises as able 1-2 x day (except ankle pumps every hour). Pt will benefit from skilled PT to increase their independence and safety with mobility to allow discharge to the venue listed below.      Follow Up Recommendations Follow surgeon's recommendation for DC plan and follow-up therapies;Supervision for mobility/OOB    Equipment Recommendations  None recommended by PT (has dme)    Recommendations for Other Services       Precautions / Restrictions Precautions Precautions: None      Mobility  Bed Mobility Overal bed mobility: Needs Assistance Bed Mobility: Supine to Sit     Supine to sit: Supervision;HOB elevated        Transfers Overall transfer level: Needs assistance Equipment used: Rolling walker (2 wheeled) Transfers: Sit to/from Stand Sit to Stand: Supervision         General transfer comment: supervision for safety; performed x2  Ambulation/Gait Ambulation/Gait assistance: Supervision Gait Distance (Feet): 300 Feet Assistive device: Rolling walker (2 wheeled) Gait Pattern/deviations: Decreased stride length Gait velocity: decreased   General Gait Details: min cues for RW proximity and use; steady  Stairs Stairs: Yes Stairs assistance: Min guard Stair Management: One rail Right;Step  to pattern;Forwards Number of Stairs: 5 General stair comments: min guard for safety; cues for "up with good down with bad"  Wheelchair Mobility    Modified Rankin (Stroke Patients Only)       Balance Overall balance assessment: Needs assistance Sitting-balance support: No upper extremity supported;Feet supported Sitting balance-Leahy Scale: Normal     Standing balance support: No upper extremity supported;During functional activity Standing balance-Leahy Scale: Good                               Pertinent Vitals/Pain Pain Assessment: No/denies pain    Home Living Family/patient expects to be discharged to:: Private residence Living Arrangements: Spouse/significant other Available Help at Discharge: Family;Available 24 hours/day (wife works but took a week off to help) Type of Home: House Home Access: Stairs to enter Entrance Stairs-Rails: Right Entrance Stairs-Number of Steps: 3 Home Layout: One level Home Equipment: Environmental consultant - 2 wheels;Bedside commode;Shower seat;Cane - single point      Prior Function Level of Independence: Independent         Comments: Likes to hike in [place]        Extremity/Trunk Assessment   Upper Extremity Assessment Upper Extremity Assessment: Overall WFL for tasks assessed    Lower Extremity Assessment Lower Extremity Assessment: LLE deficits/detail LLE Deficits / Details: Demonstrated WFL ROM and MMT 2/5 hip and 3/5 knee and ankle       Communication   Communication: No difficulties  Cognition Arousal/Alertness: Awake/alert  Behavior During Therapy: WFL for tasks assessed/performed Overall Cognitive Status: Within Functional Limits for tasks assessed                                        General Comments General comments (skin integrity, edema, etc.): Educated on HEP of ankle pumps, LAQ, quad sets, and heel slides (as comfortably able) . Discussed not overdoing and using  gait belt to assist on heel slides as needed.  Encouraged walking short distances every couple hours at home.    Exercises Total Joint Exercises Ankle Circles/Pumps: AROM;Both;10 reps;Seated Quad Sets: AROM;Left;5 reps;Seated Long Arc Quad: AROM;Left;5 reps;Seated   Assessment/Plan    PT Assessment Patient needs continued PT services  PT Problem List Decreased strength;Decreased mobility;Decreased range of motion;Decreased coordination;Decreased activity tolerance;Decreased balance;Decreased knowledge of use of DME       PT Treatment Interventions DME instruction;Therapeutic activities;Gait training;Therapeutic exercise;Stair training;Balance training;Functional mobility training;Patient/family education;Modalities    PT Goals (Current goals can be found in the Care Plan section)  Acute Rehab PT Goals Patient Stated Goal: return to hiking this fall PT Goal Formulation: With patient Time For Goal Achievement: 08/09/20 Potential to Achieve Goals: Good    Frequency 7X/week   Barriers to discharge        Co-evaluation               AM-PAC PT "6 Clicks" Mobility  Outcome Measure Help needed turning from your back to your side while in a flat bed without using bedrails?: None Help needed moving from lying on your back to sitting on the side of a flat bed without using bedrails?: None Help needed moving to and from a bed to a chair (including a wheelchair)?: None Help needed standing up from a chair using your arms (e.g., wheelchair or bedside chair)?: None Help needed to walk in hospital room?: None Help needed climbing 3-5 steps with a railing? : None 6 Click Score: 24    End of Session Equipment Utilized During Treatment: Gait belt Activity Tolerance: Patient tolerated treatment well Patient left: with chair alarm set;in chair;with call bell/phone within reach Nurse Communication: Mobility status PT Visit Diagnosis: Muscle weakness (generalized) (M62.81);Other  abnormalities of gait and mobility (R26.89)    Time: 2706-2376 PT Time Calculation (min) (ACUTE ONLY): 48 min   Charges:   PT Evaluation $PT Eval Low Complexity: 1 Low PT Treatments $Therapeutic Exercise: 8-22 mins $Therapeutic Activity: 8-22 mins        Anise Salvo, PT Acute Rehab Services Pager [phone] [facility] [phone]    Rayetta Humphrey 07/26/2020, 11:11 AM

## 2020-07-26 NOTE — Progress Notes (Signed)
   Subjective: 1 Day Post-Op Procedure(s) (LRB): TOTAL HIP ARTHROPLASTY ANTERIOR APPROACH (Left) Patient reports pain as mild.   Patient seen in rounds with Dr. Lequita Halt. Patient is well, and has had no acute complaints or problems. States he is ready to go home. Denies chest pain or SOB. Foley catheter removed this AM. No issues overnight.  We will begin therapy today.   Objective: Vital signs in last 24 hours: Temp:  [97.5 F (36.4 C)-98 F (36.7 C)] 97.9 F (36.6 C) (08/26 0601) Pulse Rate:  [57-89] 69 (08/26 0601) Resp:  [12-18] 16 (08/26 0601) BP: (88-193)/(50-88) 109/63 (08/26 0601) SpO2:  [92 %-100 %] 100 % (08/26 0601) Weight:  [86.3 kg] 86.3 kg (08/25 1226)  Intake/Output from previous day:  Intake/Output Summary (Last 24 hours) at 07/26/2020 0754 Last data filed at 07/26/2020 0654 Gross per 24 hour  Intake 3158.75 ml  Output 2800 ml  Net 358.75 ml     Intake/Output this shift: No intake/output data recorded.  Labs: Recent Labs    07/26/20 0237  HGB 12.4*   Recent Labs    07/26/20 0237  WBC 12.6*  RBC 3.99*  HCT 36.2*  PLT 190   Recent Labs    07/26/20 0237  NA 130*  K 3.8  CL 98  CO2 21*  BUN 11  CREATININE 0.73  GLUCOSE 195*  CALCIUM 8.2*   No results for input(s): LABPT, INR in the last 72 hours.  Exam: General - Patient is Alert and Oriented Extremity - Neurologically intact Neurovascular intact Sensation intact distally Dorsiflexion/Plantar flexion intact Dressing - dressing C/D/I Motor Function - intact, moving foot and toes well on exam.   Past Medical History:  Diagnosis Date  . Arthritis   . Bronchitis   . COPD (chronic obstructive pulmonary disease) (HCC)    chronic bronchitis  . History of palpitations    As a teenager  . Neuromuscular disorder (HCC)   . Pneumonia   . Pre-diabetes     Assessment/Plan: 1 Day Post-Op Procedure(s) (LRB): TOTAL HIP ARTHROPLASTY ANTERIOR APPROACH (Left) Principal Problem:   OA  (osteoarthritis) of hip Active Problems:   Primary osteoarthritis of left hip  Estimated body mass index is 25.8 kg/m as calculated from the following:   Height as of this encounter: 6' (1.829 m).   Weight as of this encounter: 86.3 kg. Advance diet Up with therapy D/C IV fluids  DVT Prophylaxis - Aspirin Weight bearing as tolerated. Begin therapy.  Plan is to go Home after hospital stay. Plan for discharge later today if progresses with therapy and meeting his goals. HEP. Follow-up in the office in 2 weeks.   The PDMP database was reviewed today (07/26/2020) prior to any opioid medications being prescribed to this patient.   Arther Abbott, PA-C Orthopedic Surgery [phone] 07/26/2020, 7:54 AM

## 2020-07-27 ENCOUNTER — Encounter (HOSPITAL_COMMUNITY): Payer: Self-pay | Admitting: Orthopedic Surgery

## 2020-07-27 NOTE — Anesthesia Postprocedure Evaluation (Signed)
Anesthesia Post Note  Patient: Stephen Reilly  Procedure(s) Performed: TOTAL HIP ARTHROPLASTY ANTERIOR APPROACH (Left Hip)     Patient location during evaluation: PACU Anesthesia Type: MAC and Spinal Level of consciousness: awake and alert Pain management: pain level controlled Vital Signs Assessment: post-procedure vital signs reviewed and stable Respiratory status: spontaneous breathing, nonlabored ventilation, respiratory function stable and patient connected to nasal cannula oxygen Cardiovascular status: stable and blood pressure returned to baseline Postop Assessment: no apparent nausea or vomiting and spinal receding Anesthetic complications: no   No complications documented.  Last Vitals:  Vitals:   07/26/20 0601 07/26/20 0959  BP: 109/63 113/67  Pulse: 69 67  Resp: 16 18  Temp: 36.6 C 36.6 C  SpO2: 100% 98%    Last Pain:  Vitals:   07/26/20 1308  TempSrc:   PainSc: 6                  Rutha Melgoza

## 2020-08-01 ENCOUNTER — Encounter (HOSPITAL_COMMUNITY): Payer: Self-pay | Admitting: Orthopedic Surgery

## 2021-01-29 DEATH — deceased

## 2021-05-28 IMAGING — RF DG HIP (WITH PELVIS) OPERATIVE*L*
1 series · 1 of 1 positions shown · non-contrast
Comparison: None.

CLINICAL DATA: Left total hip arthroplasty

EXAM:
OPERATIVE left HIP (WITH PELVIS IF PERFORMED) 1 VIEWS
TECHNIQUE: Fluoroscopic spot image(s) were submitted for interpretation
post-operatively.

[Series 1: unknown protocol · 0.20mm/px · 1 of 1 slices shown]
[im 1/1]
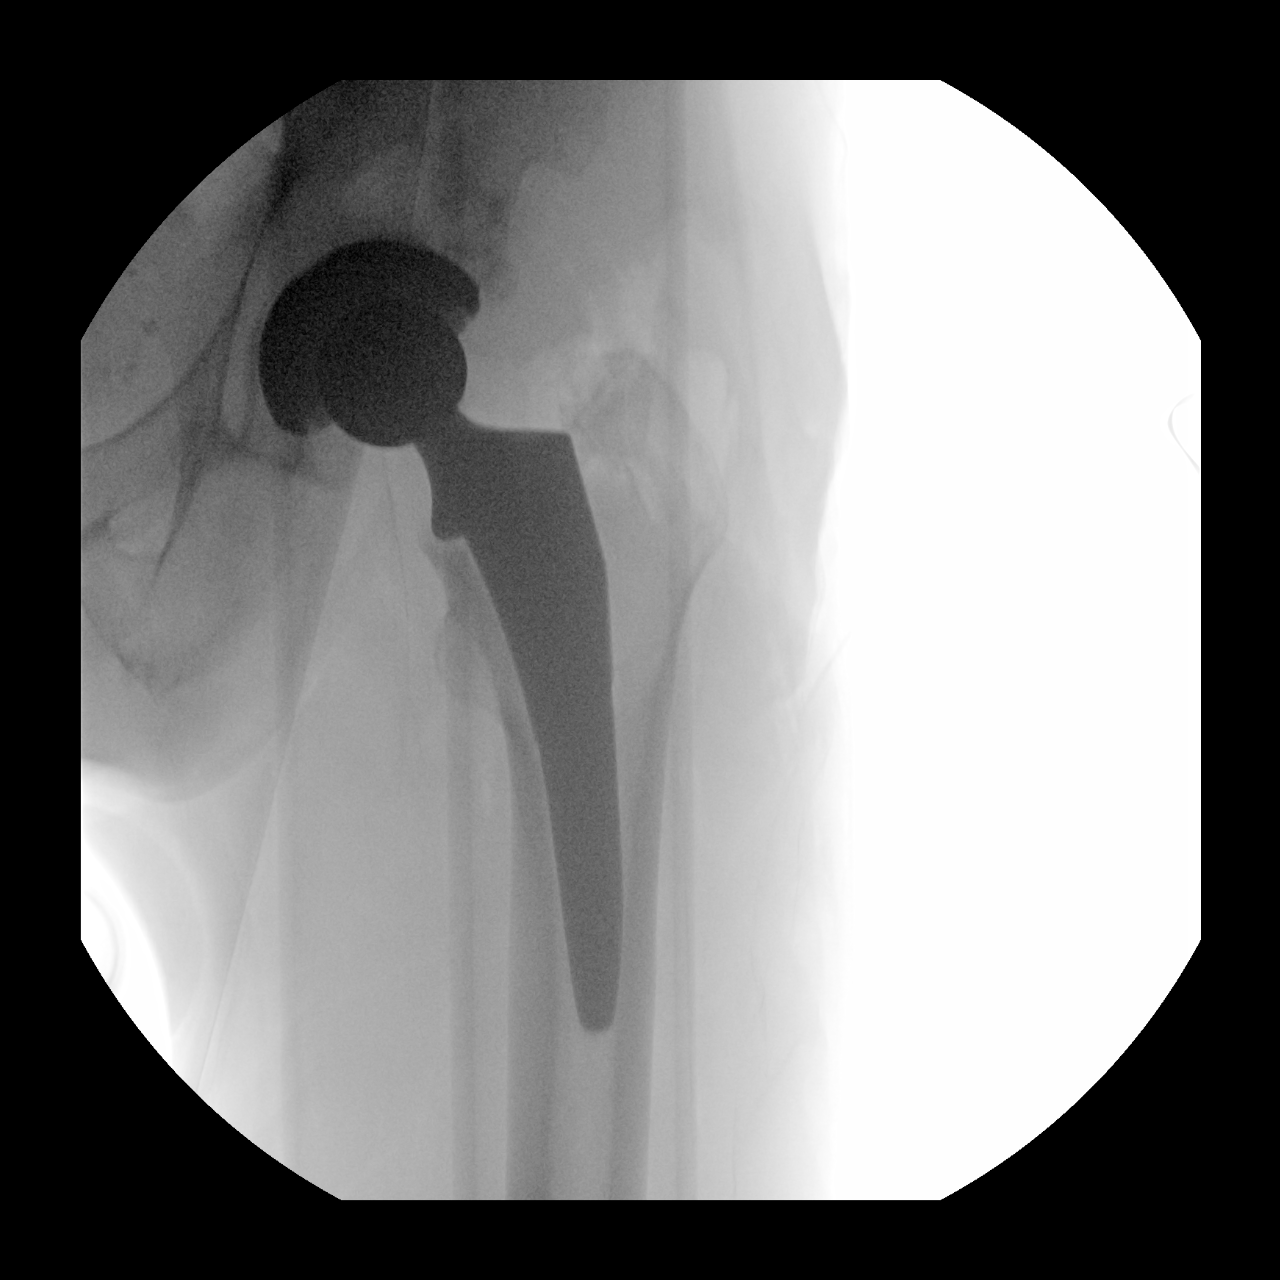

[1 of 1 positions shown; findings below may reference images not displayed]

FINDINGS: Single low resolution intraoperative spot view of the left hip.
Total fluoroscopy time is 11.7 seconds. The image demonstrates a
left hip arthroplasty in normal alignment.
IMPRESSION: Intraoperative fluoroscopic assistance provided during left hip
arthroplasty.
